# Patient Record
Sex: Male | Born: 1935 | Race: White | Hispanic: No | Marital: Single | Smoking: Never smoker
Health system: Southern US, Community
[De-identification: ages and names within clinical notes are randomized; demographics above are authoritative.]

## PROBLEM LIST (undated history)

## (undated) DIAGNOSIS — E785 Hyperlipidemia, unspecified: Secondary | ICD-10-CM

## (undated) DIAGNOSIS — I4891 Unspecified atrial fibrillation: Secondary | ICD-10-CM

## (undated) DIAGNOSIS — C801 Malignant (primary) neoplasm, unspecified: Secondary | ICD-10-CM

## (undated) DIAGNOSIS — G9340 Encephalopathy, unspecified: Secondary | ICD-10-CM

## (undated) DIAGNOSIS — I10 Essential (primary) hypertension: Secondary | ICD-10-CM

## (undated) DIAGNOSIS — E0590 Thyrotoxicosis, unspecified without thyrotoxic crisis or storm: Secondary | ICD-10-CM

---

## 1997-12-25 ENCOUNTER — Other Ambulatory Visit: Admission: RE | Admit: 1997-12-25 | Discharge: 1997-12-25 | Payer: Self-pay | Admitting: Urology

## 1998-08-03 ENCOUNTER — Ambulatory Visit (HOSPITAL_COMMUNITY): Admission: RE | Admit: 1998-08-03 | Discharge: 1998-08-03 | Payer: Self-pay | Admitting: Gastroenterology

## 1999-08-17 ENCOUNTER — Encounter: Admission: RE | Admit: 1999-08-17 | Discharge: 1999-08-17 | Payer: Self-pay | Admitting: Endocrinology

## 1999-08-17 ENCOUNTER — Encounter: Payer: Self-pay | Admitting: Endocrinology

## 1999-11-25 ENCOUNTER — Encounter: Admission: RE | Admit: 1999-11-25 | Discharge: 1999-11-25 | Payer: Self-pay | Admitting: *Deleted

## 1999-11-25 ENCOUNTER — Encounter: Payer: Self-pay | Admitting: *Deleted

## 2000-04-25 ENCOUNTER — Encounter (INDEPENDENT_AMBULATORY_CARE_PROVIDER_SITE_OTHER): Payer: Self-pay

## 2000-04-25 ENCOUNTER — Other Ambulatory Visit: Admission: RE | Admit: 2000-04-25 | Discharge: 2000-04-25 | Payer: Self-pay | Admitting: Urology

## 2001-11-25 ENCOUNTER — Ambulatory Visit (HOSPITAL_COMMUNITY): Admission: RE | Admit: 2001-11-25 | Discharge: 2001-11-25 | Payer: Self-pay | Admitting: Gastroenterology

## 2002-06-19 ENCOUNTER — Emergency Department (HOSPITAL_COMMUNITY): Admission: EM | Admit: 2002-06-19 | Discharge: 2002-06-19 | Payer: Self-pay | Admitting: Emergency Medicine

## 2002-06-19 ENCOUNTER — Encounter: Payer: Self-pay | Admitting: Emergency Medicine

## 2002-06-26 ENCOUNTER — Encounter: Payer: Self-pay | Admitting: Urology

## 2002-06-26 ENCOUNTER — Ambulatory Visit (HOSPITAL_BASED_OUTPATIENT_CLINIC_OR_DEPARTMENT_OTHER): Admission: RE | Admit: 2002-06-26 | Discharge: 2002-06-26 | Payer: Self-pay | Admitting: Urology

## 2002-07-01 ENCOUNTER — Emergency Department (HOSPITAL_COMMUNITY): Admission: EM | Admit: 2002-07-01 | Discharge: 2002-07-01 | Payer: Self-pay | Admitting: Emergency Medicine

## 2002-07-01 ENCOUNTER — Encounter: Payer: Self-pay | Admitting: Emergency Medicine

## 2002-07-02 ENCOUNTER — Inpatient Hospital Stay (HOSPITAL_COMMUNITY): Admission: EM | Admit: 2002-07-02 | Discharge: 2002-07-04 | Payer: Self-pay | Admitting: Urology

## 2002-07-12 ENCOUNTER — Emergency Department (HOSPITAL_COMMUNITY): Admission: EM | Admit: 2002-07-12 | Discharge: 2002-07-12 | Payer: Self-pay | Admitting: *Deleted

## 2002-07-15 ENCOUNTER — Encounter: Payer: Self-pay | Admitting: Urology

## 2002-07-16 ENCOUNTER — Inpatient Hospital Stay (HOSPITAL_COMMUNITY): Admission: RE | Admit: 2002-07-16 | Discharge: 2002-07-19 | Payer: Self-pay | Admitting: Urology

## 2002-07-16 ENCOUNTER — Encounter (INDEPENDENT_AMBULATORY_CARE_PROVIDER_SITE_OTHER): Payer: Self-pay | Admitting: *Deleted

## 2007-04-17 ENCOUNTER — Encounter: Admission: RE | Admit: 2007-04-17 | Discharge: 2007-04-17 | Payer: Self-pay | Admitting: Endocrinology

## 2009-03-03 ENCOUNTER — Emergency Department (HOSPITAL_COMMUNITY): Admission: EM | Admit: 2009-03-03 | Discharge: 2009-03-03 | Payer: Self-pay | Admitting: Emergency Medicine

## 2010-06-02 ENCOUNTER — Other Ambulatory Visit: Payer: Self-pay | Admitting: Dermatology

## 2010-07-22 NOTE — Discharge Summary (Signed)
   NAME:  Nicholas Hines, Nicholas Hines                          ACCOUNT NO.:  [id number]   MEDICAL RECORD NO.:  [id number]                   PATIENT TYPE:  INP   LOCATION:  0378                                 FACILITY:  [facility]   PHYSICIAN:  Maretta Bees. Vonita Moss, M.D.             DATE OF BIRTH:  18-Aug-1933   DATE OF ADMISSION:  07/02/2002  DATE OF DISCHARGE:  07/04/2002                                 DISCHARGE SUMMARY   FINAL DIAGNOSES:  1. Right ureteral calculi.  2. Urinary retention.  3. Diabetes mellitus type 2, non-insulin-dependent.  4. Hypertension.  5. Diaphragmatic hernia.  6. Right bundle branch block.   PROCEDURE:  1. Cystoscopy.  2. Right retrograde pyelogram with interpretation.  3. Right ureteroscopy.  4. Holmium stone fragmentation and stone basketing.  5. Insertion of right double J catheter July 02, 2002.   HISTORY:  This 77 year old gentleman had a long history of urinary tract  stone disease.  He had a right upper ureteral stone that was treated with  ESL one week before with very little fragmentation.  He did pass a left  ureteral calculus in the meantime and he was brought to the ER today with  severe right flank pain due to the 8 mm stone in the distal right ureter  that has not passed and a new stone in the right upper ureter that used to  be in the kidney measuring 9 mm in size.  He is brought to the operating  room where both stones underwent a Holmium laser fragmentation and the  distal stone was removed completely.  Double J catheter was left in place  and he was observed overnight.  However, he had trouble voiding the next day  and required a dose of Flomax and unfortunately still could not void after  voiding trial and was sent home with a Foley catheter on July 04, 2002.  He  is instructed to remove the Foley catheter in two days and go home on his  usual medications including Actos, Maxzide, Proscar, Cardura, allopurinol,  Zocor, and Pepcid.  He will return to  the office in one week for follow-up.  He is sent home in satisfactory condition.  May resume diabetic diet.                                               Maretta Bees. Vonita Moss, M.D.    LJP/MEDQ  D:  07/09/2002  T:  07/09/2002  Job:  [id number]

## 2010-07-22 NOTE — Consult Note (Signed)
   NAME:  Nicholas Hines, Nicholas Hines NO.:  [id number]   MEDICAL RECORD NO.:  [id number]                   PATIENT TYPE:  EMS   LOCATION:  ED                                   FACILITY:   PHYSICIAN:  Maretta Bees. Vonita Moss, M.D.             DATE OF BIRTH:  01-25-1934   DATE OF CONSULTATION:  06/19/2002  DATE OF DISCHARGE:                                   CONSULTATION   EMERGENCY ROOM CONSULTATION:   REASON FOR CONSULTATION:  I was asked to see this 77 year old gentleman by  Dr. Andres Labrum for evaluation of severe right flank pain.  He has had a long  history of stones.  He has had a basket extraction in the past.  He passed  some small stones about a month ago, but he came in this morning with severe  right flank pain.  He obtained relief in the emergency room here.   He also has history of prostatism and takes Proscar and Cardura and is a  patient of Dr. Etta Grandchild.   MEDICATIONS:  1. Triamterene/HCTZ 75/50.  2. Actos for his diabetes.  3. Zocor for hypercholesterolemia.  4. Zantac for GI distress.  5. Allopurinol.  6. Aspirin 81 mg.   ALLERGIES:  SULFA.   REVIEW OF SYMPTOMS:  He has had no acute GI symptoms.  He has had no fever.  He has had no gross hematuria.   PHYSICAL EXAMINATION:  VITAL SIGNS:  Blood pressure 177/91, respiratory rate  20, heart rate is 66.  GENERAL:  He is alert and oriented. He is now much more relieved.  SKIN:  Warm and dry.  NECK:  Supple.  LUNGS:  No respiratory distress.  ABDOMEN:  Soft and nontender.  PELVIRECTAL:  Penis, urethra, meatus, testicles and epididymis without  lesions.   LABORATORY AND ACCESSORY DATA:  CT scan was done.  He has small renal  calculi, but he has an obstructing 8-mm stone of the right UPJ and there is  a question of  a distal left nonobstructing ureteral stone.   DISCHARGE INSTRUCTIONS:  He was sent home on Demerol and are office will  call and get him scheduled to see Dr. Etta Grandchild for followup and he  will  probably need a lithotripsy and I told him to stop his 81 mg aspirin and let  us know if his Demerol runs low.                                              Maretta Bees. Vonita Moss, M.D.   LJP/MEDQ  D:  06/19/2002  T:  06/19/2002  Job:  [id number]

## 2010-07-22 NOTE — Discharge Summary (Signed)
   NAME:  Nicholas Hines, SANOR                          ACCOUNT NO.:  [id number]   MEDICAL RECORD NO.:  [id number]                   PATIENT TYPE:  INP   LOCATION:  0348                                 FACILITY:  [facility]   PHYSICIAN:  Maretta Bees. Vonita Moss, M.D.             DATE OF BIRTH:  1933-06-05   DATE OF ADMISSION:  07/16/2002  DATE OF DISCHARGE:  07/19/2002                                 DISCHARGE SUMMARY   FINAL DIAGNOSES:  1. Right ureteral calculus.  2. Benign prostatic hypertrophy and urinary retention.  3. Hypertension.  4. Diabetes mellitus.  5. Hypercholesterolemia.   PROCEDURES:  Cystoscopy, right ureteroscopy, holmium laser fragmentation of  renal stone, retrieval of foreign body from renal pelvis, right retrograde  pyelogram, right double-J catheter, transurethral resection of the prostate  Jul 16, 2002.   HISTORY:  This 77 year old white male has had recurrent flank pain from  right renal calculi, having already undergone ESL and ureteroscopy and  holmium laser of the lowermost of his two ureteral stones and partial  fragmentation and holmium laser of the uppermost.  He also has gone into  urinary retention.  He was brought to the hospital for further evaluation  and therapy.   HOSPITAL COURSE:  After admission he underwent cystoscopy and the other  procedures noted above.  At the time of ureteroscopy a small black plastic  ring was found in the renal pelvis which may have fallen off the previous  ureteroscope or the current one but, fortunately, it was retrieved intact  with no harm to the patient.  Postoperatively, he did well with the  exception of some postoperative hemorrhage and some clot retention.  His  urine then cleared nicely.  His Foley was removed on Jul 18, 2002, and he  voided well after that.  He was afebrile and is to return to the office in  one to two weeks in follow-up.   DISCHARGE MEDICATIONS:  1. Allopurinol 100 mg daily.  2. Zocor 40 mg daily.  3. Zantac 150 mg at bedtime.  4. Actos 45 mg daily.  5. Maxzide 75/50 mg daily.  6. Cardura 8 mg daily.  7. He will also go home on 500 mg of Levaquin a day for five days.  8. He has pain medication at home as needed.   DIET:  He will follow a diabetic diet.    CONDITION ON DISCHARGE:  He was sent home in good condition.   ACTIVITY:  He will have light activity for one month.                                               Maretta Bees. Vonita Moss, M.D.    LJP/MEDQ  D:  07/28/2002  T:  07/28/2002  Job:  [id number]

## 2010-07-22 NOTE — Op Note (Signed)
NAME:  Nicholas Hines, Nicholas Hines                          ACCOUNT NO.:  [id number]   MEDICAL RECORD NO.:  [id number]                   PATIENT TYPE:  AMB   LOCATION:  NESC                                 FACILITY:  [facility]   PHYSICIAN:  Maretta Bees. Vonita Moss, M.D.             DATE OF BIRTH:  1933-03-10   DATE OF PROCEDURE:  07/02/2002  DATE OF DISCHARGE:                                 OPERATIVE REPORT   PREOPERATIVE DIAGNOSIS:  Right ureteral calculi.   POSTOPERATIVE DIAGNOSIS:  Right ureteral calculi.   PROCEDURES:  1. Cystoscopy.  2. Right retrograde pyelogram with interpretation.  3. Right ureteroscopy with holmium laser fragmentation of two ureteral     stones and stone basketing of distal stone.  4. Insertion of a right double J catheter.   SURGEON:  Maretta Bees. Vonita Moss, M.D.   ANESTHESIA:  General.   INDICATIONS:  This 77 year old gentleman has had a long history of urinary  tract calculus disease.  He has been having severe intermittent right flank  pain due to an 8 mm stone in the distal ureter that underwent ESL last week  with very little fragmentation.  He passed a left ureteral calculus in the  meantime.  He was seen in the office yesterday with severe right flank pain,  and he has an 8 mm stone in the distal right ureter and a previous right  renal calculus is now in the upper ureter and measures 9 mm in size.  He is  brought to the OR today for further evaluation and therapy and relief of his  symptoms.   DESCRIPTION OF PROCEDURE:  The patient was brought to the operating room and  placed in lithotomy position.  External genitalia were prepped and draped in  the usual fashion.  He was cystoscoped.  Anterior urethra was normal.  The  prostatic urethra was unremarkable.  The bladder was normal.  A guidewire  was placed up the right ureter beyond both stones and a 4 mm balloon  dilating device was used to dilate the intramural ureter up to the lower  stone.  I then inserted the 6  French rigid ureteroscope up to that stone and  it had some pock marks on it from ESL, but it required 365 micron laser at  0.8 watts to fragment the stone in four pieces, and I then retrieved all  four pieces with a stone basket and later got them out of the bladder.  I  then tried to advance the ureteroscope but because of angles, I could not  get up the rigid scope so I inserted a ureteral dilating sheath and then  inserted the ultra-thin flexible ureteroscope and encountered the stone in  the upper ureter.  I used the holmium laser again to fragment and fracture  this stone.  There was limited working space and because of ureteral spasm  and angulation, I felt that I had  to complete the procedure because I took  care to only laser the stone and not the sidewall, and at this point I felt  the stone was likely fractured enough to pass and certainly would be  amenable to ESL in the future.  With the guidewire still in place, I back-  loaded the cystoscope and performed a right retrograde pyelogram showing an  intact and slightly full right renal pelvis and collecting system.  Over a  guidewire I inserted a 6 [place], 26 cm double J catheter  with a full coil in the upper calyceal system of the kidney and a full coil  in the bladder, bypassing this upper ureteral stone.  The bladder was  emptied and the scope removed and the patient sent to the recovery room in  good condition, having tolerated the procedure well.                                                Maretta Bees. Vonita Moss, M.D.    LJP/MEDQ  D:  07/02/2002  T:  07/02/2002  Job:  [id number]

## 2010-07-22 NOTE — Op Note (Signed)
NAME:  Nicholas Hines, Nicholas Hines                          ACCOUNT NO.:  [id number]   MEDICAL RECORD NO.:  [id number]                   PATIENT TYPE:  INP   LOCATION:  0348                                 FACILITY:  [facility]   PHYSICIAN:  Maretta Bees. Vonita Moss, M.D.             DATE OF BIRTH:  [birth year]   DATE OF PROCEDURE:  07/16/2002  DATE OF DISCHARGE:                                 OPERATIVE REPORT   PREOPERATIVE DIAGNOSES:  1. Right upper ureteral calculus.  2. Benign prostatic hypertrophy with urinary retention.   POSTOPERATIVE DIAGNOSES:  1. Right upper ureteral calculus.  2. Benign prostatic hypertrophy with urinary retention.  3. Renal foreign body.   OPERATION/PROCEDURE:  1. Cystoscopy.  2. Right ureteroscopy.  3. Holmium laser fragmentation of renal stone and retrieval of a foreign     body from the renal pelvis.  4. Right retrograde pyelogram interpretation.  5. Right double-J catheter placement.  6. Transurethral resection of the prostate.   SURGEON:  Maretta Bees. Vonita Moss, M.D.   ANESTHESIA:  General.   INDICATIONS:  This 77 year old white male has had significant problems with  right renal calculi, having had one stone already fractured and removed by  means of ESL and ureteroscopy and holmium laser fragmentation.  A right  upper ureteral stone persists having been partially fragmented with a  holmium laser at recent ureteroscopy.  He has also been in urinary retention  having failed voiding trials despite being on chronic medical therapy.  He  is brought to the OR today for right ureteroscopy and treatment of his right  upper ureteral stone and TUR of the prostate.   DESCRIPTION OF PROCEDURE:  The patient was brought to the operating room,  placed in the lithotomy position.  External genitalia prepped and draped in  the usual fashion.  He was cystoscoped.  His right double-J catheter was  removed and the guide wire placed up beyond the right upper ureteral  calculus.  I  then inserted a long ureteral access sheath and in doing so,  pushed the kidney back up in the renal pelvis.  With the access sheath in  place, I inserted a ultra-thin flexible ureteroscope and entered the renal  pelvis where I saw the partially fragmented stone as noted above, but also a  small circular, dark gray to black foreign body that looks like a small  piece of a plastic ring.  I could not retrieve it with a flexible scope  because the grasper would not bend adequately to retrieve it, plus it was  floating around quite freely.  Fortunately, it floated down into the  ureteral access sheath and then I inserted a three-prong grasper and put it  beyond the foreign body and used to trap and bring it out through the  ureteral access sheath.  It appears to be either a small plastic ring of a  previous ureteroscopic procedure or possibly  this procedure.  The patient  will be advised postoperatively about this finding.  I then proceeded with holmium laser fragmentation of the stone which was  very hard and was tending to bounce about.  A couple of times I was able to  drop it in the calyx and broke it into at least three pieces.  I tried to  grasp one of them but it was too big to bring down the access sheath and I  feel like these fragments will now be a passable size.  Therefore, I put up  a #6-French, 26 cm double-J catheter after obtaining a right retrograde  pyelogram that showed the pyelocalyceal system to be intact.  The double J  catheter was in good position with a full coil in the renal pelvis and a  full coil in the bladder.  I then proceeded with TUR of the prostate after sounding him to 30-French  and putting in a 28-French resectoscope sheath.  He had a very vascular  prostate with a lot of tissue at the bladder neck and the floor.  When I  completed the procedure, I felt like he had a well receptive prostatic  fossa.  Residual chips were removed from the bladder.  Hemostasis  was good.  External sphincter seemed to be intact as the scope was removed.  A 24-  French, 30 mL Foley catheter was inserted with 40 mL placed in the balloon  and irrigation cleared nicely.  Traction was put on the catheter and he was  taken to the recovery room in good condition having tolerated the procedure  well.                                                 Maretta Bees. Vonita Moss, M.D.    LJP/MEDQ  D:  07/16/2002  T:  07/17/2002  Job:  [id number]

## 2010-07-22 NOTE — Op Note (Signed)
   NAME:  Nicholas Hines, Nicholas Hines                          ACCOUNT NO.:  [id number]   MEDICAL RECORD NO.:  [id number]                   PATIENT TYPE:  AMB   LOCATION:  ENDO                                 FACILITY:  [facility]   PHYSICIAN:  James L. Malon Kindle., M.D.          DATE OF BIRTH:  10/26/1933   DATE OF PROCEDURE:  11/25/2001  DATE OF DISCHARGE:                                 OPERATIVE REPORT   PROCEDURE:  Colonoscopy.   MEDICATIONS:  Fentanyl 90 mcg, Versed 9 mg IV.   ENDOSCOPE:  Olympus pediatric video colonoscope.   INDICATIONS:  A previous history of adenomatous polyps.  This is done as a  follow-up.   DESCRIPTION OF PROCEDURE:  The procedure had been explained to the patient  and consent obtained.  With the patient in the left lateral decubitus  position, the Olympus pediatric video colonoscope was inserted, advanced  under direct visualization.  Prep was excellent.  The patient had moderate  diverticular disease.  We were able to pass the sigmoid and advance fairly  easily to the cecum, the ileocecal valve and appendiceal orifice seen.  The  scope was withdrawn and the cecum, ascending colon, hepatic flexure,  transverse colon, splenic flexure, descending and sigmoid colon were seen  well and no polyps seen.  Again moderate diverticulosis in the sigmoid  colon.  The rectum was free of polyps.  The scope was withdrawn.  The  patient tolerated the procedure well.   ASSESSMENT:  1. No evidence of further colon polyps.  2. Moderate diverticulosis.   PLAN:  Will recommend yearly Hemoccults, and I will repeat the procedure in  five years.                                               James L. Malon Kindle., M.D.    Waldron Session  D:  11/25/2001  T:  11/26/2001  Job:  [id number]   cc:   Alfonse Alpers. Dagoberto Ligas, M.D.  [address]  [place]  [place] [address]  Fax: [phone]

## 2010-07-22 NOTE — H&P (Signed)
NAME:  Nicholas Hines, SCHELLHASE                          ACCOUNT NO.:  [id number]   MEDICAL RECORD NO.:  [id number]                   PATIENT TYPE:  INP   LOCATION:  0010                                 FACILITY:  [facility]   PHYSICIAN:  Maretta Bees. Vonita Moss, M.D.             DATE OF BIRTH:  [birth year]   DATE OF ADMISSION:  07/16/2002  DATE OF DISCHARGE:                                HISTORY & PHYSICAL   HISTORY OF PRESENT ILLNESS:  This 77 year old white male has had a past  history of prostatism and has been on Flomax and Proscar and also has a  history of urinary tract calculus disease.  He presented with severe right  flank pain and an 8 mm stone that was moved into the distal ureter and he  underwent ESL with very little fragmentation in April.  He returned to the  office with persistent right flank pain and a 9 mm stone now moved into the  upper ureter on the right side.  In the meantime he also passed a 5 mm stone  from the left ureter.  On July 02, 2002 he underwent right ureteroscopy and  Holmium laser fragmentation and removal of the distal stone and  fragmentation of the proximal stone and double J catheter was left in place  with the thought that that would require further intervention either with  ESL or further ureteroscopy.  However, in the meantime he has developed  recurrent urinary retention and has failed several voiding trials and then  developed fever and prostatitis and is on Levaquin.  He is brought to the  hospital today for cystoscopy, right ureteroscopy, stone fragmentation, and  TUR of the prostate.   PAST MEDICAL HISTORY:  1. Hypertension.  2. Some chronic sinus difficulties.  3. Diabetes controlled with oral agents.  4. He also has history of surgery including bilateral inguinal hernia     repair.   MEDICATIONS:  1. Levaquin 500 mg daily.  2. Allopurinol 100 mg daily.  3. Aspirin 81 mg which he has not taken for a month.  4. Zantac 150 mg at bedtime.  5. Zocor  40 mg at bedtime.  6. Cardura 8 mg daily.  7. Proscar 5 mg daily.  8. Actos 45 mg daily.  9. Maxzide 75/50 mg daily.   ALLERGIES:  He is allergic to SULFA.   FAMILY HISTORY:  He does not drink alcohol nor smoke.   FAMILY HISTORY:  Noncontributory.   REVIEW OF SYSTEMS:  Noted on health history form.   PHYSICAL EXAMINATION:  VITAL SIGNS:  Blood pressure 130/76, pulse 96,  temperature 100.2.  GENERAL:  He is alert and oriented.  He is in no acute distress.  SKIN:  Warm and dry.  ABDOMEN:  Soft, nontender.  GENITOURINARY:  Foley catheter is in place.  RECTAL:  Prostate feels benign and smooth.   IMPRESSION:  1. Right ureteral calculus.  2.  Benign prostatic hypertrophy and urinary retention.  3. Hypertension.  4. Diabetes mellitus.  5. Hypercholesterolemia.   PLAN:  Cystoscopy, retrograde pyelogram, ureteroscopy, and transurethral  resection of the prostate.                                               Maretta Bees. Vonita Moss, M.D.    LJP/MEDQ  D:  07/16/2002  T:  07/16/2002  Job:  [id number]

## 2011-01-19 ENCOUNTER — Other Ambulatory Visit: Payer: Self-pay | Admitting: Dermatology

## 2011-04-19 DIAGNOSIS — R972 Elevated prostate specific antigen [PSA]: Secondary | ICD-10-CM | POA: Diagnosis not present

## 2011-04-26 DIAGNOSIS — N200 Calculus of kidney: Secondary | ICD-10-CM | POA: Diagnosis not present

## 2011-04-26 DIAGNOSIS — R972 Elevated prostate specific antigen [PSA]: Secondary | ICD-10-CM | POA: Diagnosis not present

## 2011-04-26 DIAGNOSIS — N401 Enlarged prostate with lower urinary tract symptoms: Secondary | ICD-10-CM | POA: Diagnosis not present

## 2011-07-05 DIAGNOSIS — E785 Hyperlipidemia, unspecified: Secondary | ICD-10-CM | POA: Diagnosis not present

## 2011-07-05 DIAGNOSIS — E119 Type 2 diabetes mellitus without complications: Secondary | ICD-10-CM | POA: Diagnosis not present

## 2011-07-05 DIAGNOSIS — I10 Essential (primary) hypertension: Secondary | ICD-10-CM | POA: Diagnosis not present

## 2011-09-18 DIAGNOSIS — K219 Gastro-esophageal reflux disease without esophagitis: Secondary | ICD-10-CM | POA: Diagnosis not present

## 2011-09-18 DIAGNOSIS — R0789 Other chest pain: Secondary | ICD-10-CM | POA: Diagnosis not present

## 2011-10-13 DIAGNOSIS — R21 Rash and other nonspecific skin eruption: Secondary | ICD-10-CM | POA: Diagnosis not present

## 2011-10-16 DIAGNOSIS — T7840XA Allergy, unspecified, initial encounter: Secondary | ICD-10-CM | POA: Diagnosis not present

## 2011-10-16 DIAGNOSIS — R21 Rash and other nonspecific skin eruption: Secondary | ICD-10-CM | POA: Diagnosis not present

## 2011-10-20 DIAGNOSIS — Z86010 Personal history of colonic polyps: Secondary | ICD-10-CM | POA: Diagnosis not present

## 2011-10-20 DIAGNOSIS — Z09 Encounter for follow-up examination after completed treatment for conditions other than malignant neoplasm: Secondary | ICD-10-CM | POA: Diagnosis not present

## 2011-10-20 DIAGNOSIS — K5730 Diverticulosis of large intestine without perforation or abscess without bleeding: Secondary | ICD-10-CM | POA: Diagnosis not present

## 2011-10-20 DIAGNOSIS — K648 Other hemorrhoids: Secondary | ICD-10-CM | POA: Diagnosis not present

## 2011-10-20 DIAGNOSIS — D175 Benign lipomatous neoplasm of intra-abdominal organs: Secondary | ICD-10-CM | POA: Diagnosis not present

## 2011-12-12 DIAGNOSIS — H26019 Infantile and juvenile cortical, lamellar, or zonular cataract, unspecified eye: Secondary | ICD-10-CM | POA: Diagnosis not present

## 2011-12-12 DIAGNOSIS — E119 Type 2 diabetes mellitus without complications: Secondary | ICD-10-CM | POA: Diagnosis not present

## 2011-12-12 DIAGNOSIS — H2510 Age-related nuclear cataract, unspecified eye: Secondary | ICD-10-CM | POA: Diagnosis not present

## 2012-01-08 DIAGNOSIS — E785 Hyperlipidemia, unspecified: Secondary | ICD-10-CM | POA: Diagnosis not present

## 2012-01-08 DIAGNOSIS — I10 Essential (primary) hypertension: Secondary | ICD-10-CM | POA: Diagnosis not present

## 2012-01-08 DIAGNOSIS — Z23 Encounter for immunization: Secondary | ICD-10-CM | POA: Diagnosis not present

## 2012-01-08 DIAGNOSIS — E119 Type 2 diabetes mellitus without complications: Secondary | ICD-10-CM | POA: Diagnosis not present

## 2012-01-17 DIAGNOSIS — L821 Other seborrheic keratosis: Secondary | ICD-10-CM | POA: Diagnosis not present

## 2012-01-17 DIAGNOSIS — L570 Actinic keratosis: Secondary | ICD-10-CM | POA: Diagnosis not present

## 2012-01-17 DIAGNOSIS — Z85828 Personal history of other malignant neoplasm of skin: Secondary | ICD-10-CM | POA: Diagnosis not present

## 2012-02-06 DIAGNOSIS — J069 Acute upper respiratory infection, unspecified: Secondary | ICD-10-CM | POA: Diagnosis not present

## 2012-04-09 DIAGNOSIS — R143 Flatulence: Secondary | ICD-10-CM | POA: Diagnosis not present

## 2012-04-26 DIAGNOSIS — N401 Enlarged prostate with lower urinary tract symptoms: Secondary | ICD-10-CM | POA: Diagnosis not present

## 2012-05-03 DIAGNOSIS — N401 Enlarged prostate with lower urinary tract symptoms: Secondary | ICD-10-CM | POA: Diagnosis not present

## 2012-05-03 DIAGNOSIS — N200 Calculus of kidney: Secondary | ICD-10-CM | POA: Diagnosis not present

## 2012-05-03 DIAGNOSIS — R972 Elevated prostate specific antigen [PSA]: Secondary | ICD-10-CM | POA: Diagnosis not present

## 2012-06-18 DIAGNOSIS — R141 Gas pain: Secondary | ICD-10-CM | POA: Diagnosis not present

## 2012-07-08 DIAGNOSIS — I10 Essential (primary) hypertension: Secondary | ICD-10-CM | POA: Diagnosis not present

## 2012-07-08 DIAGNOSIS — K219 Gastro-esophageal reflux disease without esophagitis: Secondary | ICD-10-CM | POA: Diagnosis not present

## 2012-07-08 DIAGNOSIS — E785 Hyperlipidemia, unspecified: Secondary | ICD-10-CM | POA: Diagnosis not present

## 2012-07-08 DIAGNOSIS — E119 Type 2 diabetes mellitus without complications: Secondary | ICD-10-CM | POA: Diagnosis not present

## 2012-09-23 DIAGNOSIS — M25579 Pain in unspecified ankle and joints of unspecified foot: Secondary | ICD-10-CM | POA: Diagnosis not present

## 2012-11-11 DIAGNOSIS — R7989 Other specified abnormal findings of blood chemistry: Secondary | ICD-10-CM | POA: Diagnosis not present

## 2012-11-11 DIAGNOSIS — Z23 Encounter for immunization: Secondary | ICD-10-CM | POA: Diagnosis not present

## 2012-11-11 DIAGNOSIS — I10 Essential (primary) hypertension: Secondary | ICD-10-CM | POA: Diagnosis not present

## 2012-11-11 DIAGNOSIS — E785 Hyperlipidemia, unspecified: Secondary | ICD-10-CM | POA: Diagnosis not present

## 2012-11-11 DIAGNOSIS — E119 Type 2 diabetes mellitus without complications: Secondary | ICD-10-CM | POA: Diagnosis not present

## 2012-12-24 DIAGNOSIS — H2510 Age-related nuclear cataract, unspecified eye: Secondary | ICD-10-CM | POA: Diagnosis not present

## 2012-12-24 DIAGNOSIS — E119 Type 2 diabetes mellitus without complications: Secondary | ICD-10-CM | POA: Diagnosis not present

## 2012-12-24 DIAGNOSIS — IMO0002 Reserved for concepts with insufficient information to code with codable children: Secondary | ICD-10-CM | POA: Diagnosis not present

## 2013-01-15 DIAGNOSIS — D485 Neoplasm of uncertain behavior of skin: Secondary | ICD-10-CM | POA: Diagnosis not present

## 2013-01-15 DIAGNOSIS — L821 Other seborrheic keratosis: Secondary | ICD-10-CM | POA: Diagnosis not present

## 2013-01-15 DIAGNOSIS — L570 Actinic keratosis: Secondary | ICD-10-CM | POA: Diagnosis not present

## 2013-01-15 DIAGNOSIS — Z85828 Personal history of other malignant neoplasm of skin: Secondary | ICD-10-CM | POA: Diagnosis not present

## 2013-01-15 DIAGNOSIS — C4441 Basal cell carcinoma of skin of scalp and neck: Secondary | ICD-10-CM | POA: Diagnosis not present

## 2013-03-04 DIAGNOSIS — E119 Type 2 diabetes mellitus without complications: Secondary | ICD-10-CM | POA: Diagnosis not present

## 2013-03-04 DIAGNOSIS — E785 Hyperlipidemia, unspecified: Secondary | ICD-10-CM | POA: Diagnosis not present

## 2013-03-11 DIAGNOSIS — E119 Type 2 diabetes mellitus without complications: Secondary | ICD-10-CM | POA: Diagnosis not present

## 2013-03-11 DIAGNOSIS — N400 Enlarged prostate without lower urinary tract symptoms: Secondary | ICD-10-CM | POA: Diagnosis not present

## 2013-03-11 DIAGNOSIS — E785 Hyperlipidemia, unspecified: Secondary | ICD-10-CM | POA: Diagnosis not present

## 2013-03-11 DIAGNOSIS — I10 Essential (primary) hypertension: Secondary | ICD-10-CM | POA: Diagnosis not present

## 2013-03-11 DIAGNOSIS — K2100 Gastro-esophageal reflux disease with esophagitis, without bleeding: Secondary | ICD-10-CM | POA: Diagnosis not present

## 2013-04-03 DIAGNOSIS — H9190 Unspecified hearing loss, unspecified ear: Secondary | ICD-10-CM | POA: Diagnosis not present

## 2013-04-03 DIAGNOSIS — H905 Unspecified sensorineural hearing loss: Secondary | ICD-10-CM | POA: Diagnosis not present

## 2013-04-03 DIAGNOSIS — H903 Sensorineural hearing loss, bilateral: Secondary | ICD-10-CM | POA: Diagnosis not present

## 2013-04-24 ENCOUNTER — Other Ambulatory Visit: Payer: Self-pay | Admitting: Gastroenterology

## 2013-04-24 DIAGNOSIS — R1011 Right upper quadrant pain: Secondary | ICD-10-CM

## 2013-04-24 DIAGNOSIS — R141 Gas pain: Secondary | ICD-10-CM | POA: Diagnosis not present

## 2013-04-24 DIAGNOSIS — R143 Flatulence: Secondary | ICD-10-CM | POA: Diagnosis not present

## 2013-04-25 ENCOUNTER — Ambulatory Visit
Admission: RE | Admit: 2013-04-25 | Discharge: 2013-04-25 | Disposition: A | Discharge status: Home / Self Care | Payer: BC Managed Care – PPO | Source: Ambulatory Visit | Attending: Gastroenterology | Admitting: Gastroenterology

## 2013-04-25 DIAGNOSIS — R1011 Right upper quadrant pain: Secondary | ICD-10-CM

## 2013-04-25 DIAGNOSIS — N189 Chronic kidney disease, unspecified: Secondary | ICD-10-CM | POA: Diagnosis not present

## 2013-05-08 DIAGNOSIS — R1013 Epigastric pain: Secondary | ICD-10-CM | POA: Diagnosis not present

## 2013-05-08 DIAGNOSIS — K449 Diaphragmatic hernia without obstruction or gangrene: Secondary | ICD-10-CM | POA: Diagnosis not present

## 2013-05-08 DIAGNOSIS — K3189 Other diseases of stomach and duodenum: Secondary | ICD-10-CM | POA: Diagnosis not present

## 2013-05-09 ENCOUNTER — Other Ambulatory Visit (HOSPITAL_COMMUNITY): Payer: Self-pay | Admitting: Gastroenterology

## 2013-05-09 DIAGNOSIS — R1011 Right upper quadrant pain: Secondary | ICD-10-CM

## 2013-05-20 ENCOUNTER — Ambulatory Visit (HOSPITAL_COMMUNITY)
Admission: RE | Admit: 2013-05-20 | Discharge: 2013-05-20 | Disposition: A | Discharge status: Home / Self Care | Payer: Medicare Other | Source: Ambulatory Visit | Attending: Gastroenterology | Admitting: Gastroenterology

## 2013-05-20 DIAGNOSIS — R1011 Right upper quadrant pain: Secondary | ICD-10-CM

## 2013-05-20 DIAGNOSIS — R109 Unspecified abdominal pain: Secondary | ICD-10-CM | POA: Diagnosis not present

## 2013-05-20 MED ORDER — TECHNETIUM TC 99M SULFUR COLLOID
2.1000 | Freq: Once | INTRAVENOUS | Status: AC | PRN
  Administered 2013-05-20: 2.1 via ORAL

## 2013-06-19 DIAGNOSIS — R143 Flatulence: Secondary | ICD-10-CM | POA: Diagnosis not present

## 2013-06-19 DIAGNOSIS — R141 Gas pain: Secondary | ICD-10-CM | POA: Diagnosis not present

## 2013-08-01 DIAGNOSIS — R972 Elevated prostate specific antigen [PSA]: Secondary | ICD-10-CM | POA: Diagnosis not present

## 2013-08-08 DIAGNOSIS — N200 Calculus of kidney: Secondary | ICD-10-CM | POA: Diagnosis not present

## 2013-08-08 DIAGNOSIS — R972 Elevated prostate specific antigen [PSA]: Secondary | ICD-10-CM | POA: Diagnosis not present

## 2013-08-08 DIAGNOSIS — N138 Other obstructive and reflux uropathy: Secondary | ICD-10-CM | POA: Diagnosis not present

## 2013-08-08 DIAGNOSIS — N139 Obstructive and reflux uropathy, unspecified: Secondary | ICD-10-CM | POA: Diagnosis not present

## 2013-08-08 DIAGNOSIS — N401 Enlarged prostate with lower urinary tract symptoms: Secondary | ICD-10-CM | POA: Diagnosis not present

## 2013-09-08 DIAGNOSIS — E785 Hyperlipidemia, unspecified: Secondary | ICD-10-CM | POA: Diagnosis not present

## 2013-09-08 DIAGNOSIS — E119 Type 2 diabetes mellitus without complications: Secondary | ICD-10-CM | POA: Diagnosis not present

## 2013-09-08 DIAGNOSIS — I10 Essential (primary) hypertension: Secondary | ICD-10-CM | POA: Diagnosis not present

## 2013-09-10 DIAGNOSIS — E119 Type 2 diabetes mellitus without complications: Secondary | ICD-10-CM | POA: Diagnosis not present

## 2013-09-10 DIAGNOSIS — I10 Essential (primary) hypertension: Secondary | ICD-10-CM | POA: Diagnosis not present

## 2013-09-10 DIAGNOSIS — K2100 Gastro-esophageal reflux disease with esophagitis, without bleeding: Secondary | ICD-10-CM | POA: Diagnosis not present

## 2013-09-10 DIAGNOSIS — E785 Hyperlipidemia, unspecified: Secondary | ICD-10-CM | POA: Diagnosis not present

## 2013-09-11 DIAGNOSIS — R972 Elevated prostate specific antigen [PSA]: Secondary | ICD-10-CM | POA: Diagnosis not present

## 2013-12-25 DIAGNOSIS — Z23 Encounter for immunization: Secondary | ICD-10-CM | POA: Diagnosis not present

## 2013-12-30 DIAGNOSIS — H2513 Age-related nuclear cataract, bilateral: Secondary | ICD-10-CM | POA: Diagnosis not present

## 2013-12-30 DIAGNOSIS — E119 Type 2 diabetes mellitus without complications: Secondary | ICD-10-CM | POA: Diagnosis not present

## 2014-01-14 DIAGNOSIS — D1801 Hemangioma of skin and subcutaneous tissue: Secondary | ICD-10-CM | POA: Diagnosis not present

## 2014-01-14 DIAGNOSIS — L821 Other seborrheic keratosis: Secondary | ICD-10-CM | POA: Diagnosis not present

## 2014-01-14 DIAGNOSIS — L570 Actinic keratosis: Secondary | ICD-10-CM | POA: Diagnosis not present

## 2014-01-14 DIAGNOSIS — Z85828 Personal history of other malignant neoplasm of skin: Secondary | ICD-10-CM | POA: Diagnosis not present

## 2014-02-06 DIAGNOSIS — E119 Type 2 diabetes mellitus without complications: Secondary | ICD-10-CM | POA: Diagnosis not present

## 2014-02-06 DIAGNOSIS — E785 Hyperlipidemia, unspecified: Secondary | ICD-10-CM | POA: Diagnosis not present

## 2014-02-11 DIAGNOSIS — N400 Enlarged prostate without lower urinary tract symptoms: Secondary | ICD-10-CM | POA: Diagnosis not present

## 2014-02-11 DIAGNOSIS — I10 Essential (primary) hypertension: Secondary | ICD-10-CM | POA: Diagnosis not present

## 2014-03-13 DIAGNOSIS — M545 Low back pain: Secondary | ICD-10-CM | POA: Diagnosis not present

## 2014-03-13 DIAGNOSIS — N390 Urinary tract infection, site not specified: Secondary | ICD-10-CM | POA: Diagnosis not present

## 2014-03-13 DIAGNOSIS — R350 Frequency of micturition: Secondary | ICD-10-CM | POA: Diagnosis not present

## 2014-06-08 DIAGNOSIS — E119 Type 2 diabetes mellitus without complications: Secondary | ICD-10-CM | POA: Diagnosis not present

## 2014-06-08 DIAGNOSIS — N400 Enlarged prostate without lower urinary tract symptoms: Secondary | ICD-10-CM | POA: Diagnosis not present

## 2014-06-08 DIAGNOSIS — R972 Elevated prostate specific antigen [PSA]: Secondary | ICD-10-CM | POA: Diagnosis not present

## 2014-06-08 DIAGNOSIS — I10 Essential (primary) hypertension: Secondary | ICD-10-CM | POA: Diagnosis not present

## 2014-08-05 DIAGNOSIS — R972 Elevated prostate specific antigen [PSA]: Secondary | ICD-10-CM | POA: Diagnosis not present

## 2014-08-07 DIAGNOSIS — R42 Dizziness and giddiness: Secondary | ICD-10-CM | POA: Diagnosis not present

## 2014-08-12 DIAGNOSIS — N200 Calculus of kidney: Secondary | ICD-10-CM | POA: Diagnosis not present

## 2014-08-12 DIAGNOSIS — N401 Enlarged prostate with lower urinary tract symptoms: Secondary | ICD-10-CM | POA: Diagnosis not present

## 2014-08-12 DIAGNOSIS — R972 Elevated prostate specific antigen [PSA]: Secondary | ICD-10-CM | POA: Diagnosis not present

## 2014-08-12 DIAGNOSIS — R3916 Straining to void: Secondary | ICD-10-CM | POA: Diagnosis not present

## 2014-11-18 DIAGNOSIS — E1122 Type 2 diabetes mellitus with diabetic chronic kidney disease: Secondary | ICD-10-CM | POA: Diagnosis not present

## 2014-11-18 DIAGNOSIS — I10 Essential (primary) hypertension: Secondary | ICD-10-CM | POA: Diagnosis not present

## 2014-11-23 DIAGNOSIS — R7301 Impaired fasting glucose: Secondary | ICD-10-CM | POA: Diagnosis not present

## 2014-11-23 DIAGNOSIS — Z23 Encounter for immunization: Secondary | ICD-10-CM | POA: Diagnosis not present

## 2014-11-23 DIAGNOSIS — N400 Enlarged prostate without lower urinary tract symptoms: Secondary | ICD-10-CM | POA: Diagnosis not present

## 2014-11-23 DIAGNOSIS — I10 Essential (primary) hypertension: Secondary | ICD-10-CM | POA: Diagnosis not present

## 2014-12-22 DIAGNOSIS — H2513 Age-related nuclear cataract, bilateral: Secondary | ICD-10-CM | POA: Diagnosis not present

## 2014-12-22 DIAGNOSIS — E119 Type 2 diabetes mellitus without complications: Secondary | ICD-10-CM | POA: Diagnosis not present

## 2015-01-06 DIAGNOSIS — K219 Gastro-esophageal reflux disease without esophagitis: Secondary | ICD-10-CM | POA: Diagnosis not present

## 2015-01-06 DIAGNOSIS — R141 Gas pain: Secondary | ICD-10-CM | POA: Diagnosis not present

## 2015-01-14 DIAGNOSIS — Z85828 Personal history of other malignant neoplasm of skin: Secondary | ICD-10-CM | POA: Diagnosis not present

## 2015-01-14 DIAGNOSIS — L281 Prurigo nodularis: Secondary | ICD-10-CM | POA: Diagnosis not present

## 2015-01-14 DIAGNOSIS — D1801 Hemangioma of skin and subcutaneous tissue: Secondary | ICD-10-CM | POA: Diagnosis not present

## 2015-01-14 DIAGNOSIS — L821 Other seborrheic keratosis: Secondary | ICD-10-CM | POA: Diagnosis not present

## 2015-01-14 DIAGNOSIS — D2271 Melanocytic nevi of right lower limb, including hip: Secondary | ICD-10-CM | POA: Diagnosis not present

## 2015-01-14 DIAGNOSIS — L570 Actinic keratosis: Secondary | ICD-10-CM | POA: Diagnosis not present

## 2015-01-14 DIAGNOSIS — D692 Other nonthrombocytopenic purpura: Secondary | ICD-10-CM | POA: Diagnosis not present

## 2015-03-11 DIAGNOSIS — R05 Cough: Secondary | ICD-10-CM | POA: Diagnosis not present

## 2015-03-11 DIAGNOSIS — J00 Acute nasopharyngitis [common cold]: Secondary | ICD-10-CM | POA: Diagnosis not present

## 2015-05-26 DIAGNOSIS — R972 Elevated prostate specific antigen [PSA]: Secondary | ICD-10-CM | POA: Diagnosis not present

## 2015-05-26 DIAGNOSIS — E119 Type 2 diabetes mellitus without complications: Secondary | ICD-10-CM | POA: Diagnosis not present

## 2015-05-28 DIAGNOSIS — I10 Essential (primary) hypertension: Secondary | ICD-10-CM | POA: Diagnosis not present

## 2015-05-28 DIAGNOSIS — E119 Type 2 diabetes mellitus without complications: Secondary | ICD-10-CM | POA: Diagnosis not present

## 2015-05-28 DIAGNOSIS — E782 Mixed hyperlipidemia: Secondary | ICD-10-CM | POA: Diagnosis not present

## 2015-05-28 DIAGNOSIS — N400 Enlarged prostate without lower urinary tract symptoms: Secondary | ICD-10-CM | POA: Diagnosis not present

## 2015-06-29 DIAGNOSIS — M5033 Other cervical disc degeneration, cervicothoracic region: Secondary | ICD-10-CM | POA: Diagnosis not present

## 2015-06-29 DIAGNOSIS — M9901 Segmental and somatic dysfunction of cervical region: Secondary | ICD-10-CM | POA: Diagnosis not present

## 2015-06-30 DIAGNOSIS — M5033 Other cervical disc degeneration, cervicothoracic region: Secondary | ICD-10-CM | POA: Diagnosis not present

## 2015-06-30 DIAGNOSIS — M9901 Segmental and somatic dysfunction of cervical region: Secondary | ICD-10-CM | POA: Diagnosis not present

## 2015-07-05 DIAGNOSIS — R141 Gas pain: Secondary | ICD-10-CM | POA: Diagnosis not present

## 2015-07-05 DIAGNOSIS — K219 Gastro-esophageal reflux disease without esophagitis: Secondary | ICD-10-CM | POA: Diagnosis not present

## 2015-07-06 DIAGNOSIS — M9901 Segmental and somatic dysfunction of cervical region: Secondary | ICD-10-CM | POA: Diagnosis not present

## 2015-07-06 DIAGNOSIS — M5033 Other cervical disc degeneration, cervicothoracic region: Secondary | ICD-10-CM | POA: Diagnosis not present

## 2015-07-07 DIAGNOSIS — M9901 Segmental and somatic dysfunction of cervical region: Secondary | ICD-10-CM | POA: Diagnosis not present

## 2015-07-07 DIAGNOSIS — M5033 Other cervical disc degeneration, cervicothoracic region: Secondary | ICD-10-CM | POA: Diagnosis not present

## 2015-07-08 DIAGNOSIS — M5033 Other cervical disc degeneration, cervicothoracic region: Secondary | ICD-10-CM | POA: Diagnosis not present

## 2015-07-08 DIAGNOSIS — M9901 Segmental and somatic dysfunction of cervical region: Secondary | ICD-10-CM | POA: Diagnosis not present

## 2015-07-12 DIAGNOSIS — M5033 Other cervical disc degeneration, cervicothoracic region: Secondary | ICD-10-CM | POA: Diagnosis not present

## 2015-07-12 DIAGNOSIS — M9901 Segmental and somatic dysfunction of cervical region: Secondary | ICD-10-CM | POA: Diagnosis not present

## 2015-07-14 DIAGNOSIS — M5033 Other cervical disc degeneration, cervicothoracic region: Secondary | ICD-10-CM | POA: Diagnosis not present

## 2015-07-14 DIAGNOSIS — M9901 Segmental and somatic dysfunction of cervical region: Secondary | ICD-10-CM | POA: Diagnosis not present

## 2015-07-15 DIAGNOSIS — M9901 Segmental and somatic dysfunction of cervical region: Secondary | ICD-10-CM | POA: Diagnosis not present

## 2015-07-15 DIAGNOSIS — M5033 Other cervical disc degeneration, cervicothoracic region: Secondary | ICD-10-CM | POA: Diagnosis not present

## 2015-07-19 DIAGNOSIS — M5033 Other cervical disc degeneration, cervicothoracic region: Secondary | ICD-10-CM | POA: Diagnosis not present

## 2015-07-19 DIAGNOSIS — M9901 Segmental and somatic dysfunction of cervical region: Secondary | ICD-10-CM | POA: Diagnosis not present

## 2015-07-29 DIAGNOSIS — M5033 Other cervical disc degeneration, cervicothoracic region: Secondary | ICD-10-CM | POA: Diagnosis not present

## 2015-07-29 DIAGNOSIS — M9901 Segmental and somatic dysfunction of cervical region: Secondary | ICD-10-CM | POA: Diagnosis not present

## 2015-08-27 DIAGNOSIS — R972 Elevated prostate specific antigen [PSA]: Secondary | ICD-10-CM | POA: Diagnosis not present

## 2015-09-03 DIAGNOSIS — R350 Frequency of micturition: Secondary | ICD-10-CM | POA: Diagnosis not present

## 2015-09-03 DIAGNOSIS — R972 Elevated prostate specific antigen [PSA]: Secondary | ICD-10-CM | POA: Diagnosis not present

## 2015-09-03 DIAGNOSIS — N200 Calculus of kidney: Secondary | ICD-10-CM | POA: Diagnosis not present

## 2015-09-03 DIAGNOSIS — N401 Enlarged prostate with lower urinary tract symptoms: Secondary | ICD-10-CM | POA: Diagnosis not present

## 2015-10-06 DIAGNOSIS — E782 Mixed hyperlipidemia: Secondary | ICD-10-CM | POA: Diagnosis not present

## 2015-10-06 DIAGNOSIS — E119 Type 2 diabetes mellitus without complications: Secondary | ICD-10-CM | POA: Diagnosis not present

## 2015-10-08 DIAGNOSIS — N400 Enlarged prostate without lower urinary tract symptoms: Secondary | ICD-10-CM | POA: Diagnosis not present

## 2015-10-08 DIAGNOSIS — I10 Essential (primary) hypertension: Secondary | ICD-10-CM | POA: Diagnosis not present

## 2015-11-02 ENCOUNTER — Other Ambulatory Visit: Payer: Self-pay

## 2015-12-08 DIAGNOSIS — Z23 Encounter for immunization: Secondary | ICD-10-CM | POA: Diagnosis not present

## 2016-01-04 DIAGNOSIS — H5211 Myopia, right eye: Secondary | ICD-10-CM | POA: Diagnosis not present

## 2016-01-04 DIAGNOSIS — E119 Type 2 diabetes mellitus without complications: Secondary | ICD-10-CM | POA: Diagnosis not present

## 2016-01-04 DIAGNOSIS — H524 Presbyopia: Secondary | ICD-10-CM | POA: Diagnosis not present

## 2016-01-04 DIAGNOSIS — H52203 Unspecified astigmatism, bilateral: Secondary | ICD-10-CM | POA: Diagnosis not present

## 2016-01-04 DIAGNOSIS — H2513 Age-related nuclear cataract, bilateral: Secondary | ICD-10-CM | POA: Diagnosis not present

## 2016-01-26 DIAGNOSIS — L821 Other seborrheic keratosis: Secondary | ICD-10-CM | POA: Diagnosis not present

## 2016-01-26 DIAGNOSIS — Z85828 Personal history of other malignant neoplasm of skin: Secondary | ICD-10-CM | POA: Diagnosis not present

## 2016-01-26 DIAGNOSIS — D692 Other nonthrombocytopenic purpura: Secondary | ICD-10-CM | POA: Diagnosis not present

## 2016-01-26 DIAGNOSIS — D485 Neoplasm of uncertain behavior of skin: Secondary | ICD-10-CM | POA: Diagnosis not present

## 2016-01-26 DIAGNOSIS — D1801 Hemangioma of skin and subcutaneous tissue: Secondary | ICD-10-CM | POA: Diagnosis not present

## 2016-01-26 DIAGNOSIS — L570 Actinic keratosis: Secondary | ICD-10-CM | POA: Diagnosis not present

## 2016-02-03 DIAGNOSIS — E039 Hypothyroidism, unspecified: Secondary | ICD-10-CM | POA: Diagnosis not present

## 2016-02-03 DIAGNOSIS — E782 Mixed hyperlipidemia: Secondary | ICD-10-CM | POA: Diagnosis not present

## 2016-02-03 DIAGNOSIS — E785 Hyperlipidemia, unspecified: Secondary | ICD-10-CM | POA: Diagnosis not present

## 2016-02-03 DIAGNOSIS — E119 Type 2 diabetes mellitus without complications: Secondary | ICD-10-CM | POA: Diagnosis not present

## 2016-02-03 DIAGNOSIS — I10 Essential (primary) hypertension: Secondary | ICD-10-CM | POA: Diagnosis not present

## 2016-02-03 DIAGNOSIS — R7301 Impaired fasting glucose: Secondary | ICD-10-CM | POA: Diagnosis not present

## 2016-02-03 DIAGNOSIS — N529 Male erectile dysfunction, unspecified: Secondary | ICD-10-CM | POA: Diagnosis not present

## 2016-02-03 DIAGNOSIS — I482 Chronic atrial fibrillation: Secondary | ICD-10-CM | POA: Diagnosis not present

## 2016-02-07 DIAGNOSIS — E782 Mixed hyperlipidemia: Secondary | ICD-10-CM | POA: Diagnosis not present

## 2016-02-07 DIAGNOSIS — E119 Type 2 diabetes mellitus without complications: Secondary | ICD-10-CM | POA: Diagnosis not present

## 2016-02-07 DIAGNOSIS — I10 Essential (primary) hypertension: Secondary | ICD-10-CM | POA: Diagnosis not present

## 2016-03-31 DIAGNOSIS — J060 Acute laryngopharyngitis: Secondary | ICD-10-CM | POA: Diagnosis not present

## 2016-03-31 DIAGNOSIS — R05 Cough: Secondary | ICD-10-CM | POA: Diagnosis not present

## 2016-06-20 DIAGNOSIS — I10 Essential (primary) hypertension: Secondary | ICD-10-CM | POA: Diagnosis not present

## 2016-06-20 DIAGNOSIS — R7301 Impaired fasting glucose: Secondary | ICD-10-CM | POA: Diagnosis not present

## 2016-06-23 DIAGNOSIS — N400 Enlarged prostate without lower urinary tract symptoms: Secondary | ICD-10-CM | POA: Diagnosis not present

## 2016-06-23 DIAGNOSIS — R972 Elevated prostate specific antigen [PSA]: Secondary | ICD-10-CM | POA: Diagnosis not present

## 2016-06-23 DIAGNOSIS — E782 Mixed hyperlipidemia: Secondary | ICD-10-CM | POA: Diagnosis not present

## 2016-06-23 DIAGNOSIS — I10 Essential (primary) hypertension: Secondary | ICD-10-CM | POA: Diagnosis not present

## 2016-06-23 DIAGNOSIS — E1140 Type 2 diabetes mellitus with diabetic neuropathy, unspecified: Secondary | ICD-10-CM | POA: Diagnosis not present

## 2016-06-23 DIAGNOSIS — Z6829 Body mass index (BMI) 29.0-29.9, adult: Secondary | ICD-10-CM | POA: Diagnosis not present

## 2016-07-04 DIAGNOSIS — R071 Chest pain on breathing: Secondary | ICD-10-CM | POA: Diagnosis not present

## 2016-07-04 DIAGNOSIS — R079 Chest pain, unspecified: Secondary | ICD-10-CM | POA: Diagnosis not present

## 2016-07-06 DIAGNOSIS — M5033 Other cervical disc degeneration, cervicothoracic region: Secondary | ICD-10-CM | POA: Diagnosis not present

## 2016-07-06 DIAGNOSIS — M9901 Segmental and somatic dysfunction of cervical region: Secondary | ICD-10-CM | POA: Diagnosis not present

## 2016-07-10 DIAGNOSIS — M5033 Other cervical disc degeneration, cervicothoracic region: Secondary | ICD-10-CM | POA: Diagnosis not present

## 2016-07-10 DIAGNOSIS — M9901 Segmental and somatic dysfunction of cervical region: Secondary | ICD-10-CM | POA: Diagnosis not present

## 2016-07-13 DIAGNOSIS — M5033 Other cervical disc degeneration, cervicothoracic region: Secondary | ICD-10-CM | POA: Diagnosis not present

## 2016-07-13 DIAGNOSIS — M9901 Segmental and somatic dysfunction of cervical region: Secondary | ICD-10-CM | POA: Diagnosis not present

## 2016-07-17 DIAGNOSIS — M9901 Segmental and somatic dysfunction of cervical region: Secondary | ICD-10-CM | POA: Diagnosis not present

## 2016-07-17 DIAGNOSIS — M5033 Other cervical disc degeneration, cervicothoracic region: Secondary | ICD-10-CM | POA: Diagnosis not present

## 2016-07-20 DIAGNOSIS — M9901 Segmental and somatic dysfunction of cervical region: Secondary | ICD-10-CM | POA: Diagnosis not present

## 2016-07-20 DIAGNOSIS — M5033 Other cervical disc degeneration, cervicothoracic region: Secondary | ICD-10-CM | POA: Diagnosis not present

## 2016-07-24 DIAGNOSIS — M5033 Other cervical disc degeneration, cervicothoracic region: Secondary | ICD-10-CM | POA: Diagnosis not present

## 2016-07-24 DIAGNOSIS — M9901 Segmental and somatic dysfunction of cervical region: Secondary | ICD-10-CM | POA: Diagnosis not present

## 2016-07-27 DIAGNOSIS — M5033 Other cervical disc degeneration, cervicothoracic region: Secondary | ICD-10-CM | POA: Diagnosis not present

## 2016-07-27 DIAGNOSIS — M9901 Segmental and somatic dysfunction of cervical region: Secondary | ICD-10-CM | POA: Diagnosis not present

## 2016-08-03 DIAGNOSIS — M9901 Segmental and somatic dysfunction of cervical region: Secondary | ICD-10-CM | POA: Diagnosis not present

## 2016-08-03 DIAGNOSIS — M5033 Other cervical disc degeneration, cervicothoracic region: Secondary | ICD-10-CM | POA: Diagnosis not present

## 2016-08-10 DIAGNOSIS — M9901 Segmental and somatic dysfunction of cervical region: Secondary | ICD-10-CM | POA: Diagnosis not present

## 2016-08-10 DIAGNOSIS — M5033 Other cervical disc degeneration, cervicothoracic region: Secondary | ICD-10-CM | POA: Diagnosis not present

## 2016-08-16 DIAGNOSIS — R972 Elevated prostate specific antigen [PSA]: Secondary | ICD-10-CM | POA: Diagnosis not present

## 2016-08-17 DIAGNOSIS — M9901 Segmental and somatic dysfunction of cervical region: Secondary | ICD-10-CM | POA: Diagnosis not present

## 2016-08-17 DIAGNOSIS — M5033 Other cervical disc degeneration, cervicothoracic region: Secondary | ICD-10-CM | POA: Diagnosis not present

## 2016-08-23 DIAGNOSIS — N401 Enlarged prostate with lower urinary tract symptoms: Secondary | ICD-10-CM | POA: Diagnosis not present

## 2016-08-23 DIAGNOSIS — R972 Elevated prostate specific antigen [PSA]: Secondary | ICD-10-CM | POA: Diagnosis not present

## 2016-08-23 DIAGNOSIS — R351 Nocturia: Secondary | ICD-10-CM | POA: Diagnosis not present

## 2016-10-25 DIAGNOSIS — K5730 Diverticulosis of large intestine without perforation or abscess without bleeding: Secondary | ICD-10-CM | POA: Diagnosis not present

## 2016-10-25 DIAGNOSIS — K640 First degree hemorrhoids: Secondary | ICD-10-CM | POA: Diagnosis not present

## 2016-10-25 DIAGNOSIS — Z86010 Personal history of colonic polyps: Secondary | ICD-10-CM | POA: Diagnosis not present

## 2016-12-21 DIAGNOSIS — I10 Essential (primary) hypertension: Secondary | ICD-10-CM | POA: Diagnosis not present

## 2016-12-21 DIAGNOSIS — E1140 Type 2 diabetes mellitus with diabetic neuropathy, unspecified: Secondary | ICD-10-CM | POA: Diagnosis not present

## 2016-12-21 DIAGNOSIS — R972 Elevated prostate specific antigen [PSA]: Secondary | ICD-10-CM | POA: Diagnosis not present

## 2016-12-26 DIAGNOSIS — Z681 Body mass index (BMI) 19 or less, adult: Secondary | ICD-10-CM | POA: Diagnosis not present

## 2016-12-26 DIAGNOSIS — Z23 Encounter for immunization: Secondary | ICD-10-CM | POA: Diagnosis not present

## 2016-12-26 DIAGNOSIS — I10 Essential (primary) hypertension: Secondary | ICD-10-CM | POA: Diagnosis not present

## 2016-12-26 DIAGNOSIS — N401 Enlarged prostate with lower urinary tract symptoms: Secondary | ICD-10-CM | POA: Diagnosis not present

## 2016-12-26 DIAGNOSIS — E790 Hyperuricemia without signs of inflammatory arthritis and tophaceous disease: Secondary | ICD-10-CM | POA: Diagnosis not present

## 2016-12-26 DIAGNOSIS — E782 Mixed hyperlipidemia: Secondary | ICD-10-CM | POA: Diagnosis not present

## 2016-12-26 DIAGNOSIS — R972 Elevated prostate specific antigen [PSA]: Secondary | ICD-10-CM | POA: Diagnosis not present

## 2017-01-30 DIAGNOSIS — H52203 Unspecified astigmatism, bilateral: Secondary | ICD-10-CM | POA: Diagnosis not present

## 2017-01-30 DIAGNOSIS — E119 Type 2 diabetes mellitus without complications: Secondary | ICD-10-CM | POA: Diagnosis not present

## 2017-01-30 DIAGNOSIS — H5213 Myopia, bilateral: Secondary | ICD-10-CM | POA: Diagnosis not present

## 2017-01-30 DIAGNOSIS — Z7984 Long term (current) use of oral hypoglycemic drugs: Secondary | ICD-10-CM | POA: Diagnosis not present

## 2017-01-30 DIAGNOSIS — H2513 Age-related nuclear cataract, bilateral: Secondary | ICD-10-CM | POA: Diagnosis not present

## 2017-01-30 DIAGNOSIS — H524 Presbyopia: Secondary | ICD-10-CM | POA: Diagnosis not present

## 2017-01-31 DIAGNOSIS — D1801 Hemangioma of skin and subcutaneous tissue: Secondary | ICD-10-CM | POA: Diagnosis not present

## 2017-01-31 DIAGNOSIS — Z85828 Personal history of other malignant neoplasm of skin: Secondary | ICD-10-CM | POA: Diagnosis not present

## 2017-01-31 DIAGNOSIS — L570 Actinic keratosis: Secondary | ICD-10-CM | POA: Diagnosis not present

## 2017-01-31 DIAGNOSIS — L821 Other seborrheic keratosis: Secondary | ICD-10-CM | POA: Diagnosis not present

## 2017-04-25 DIAGNOSIS — R972 Elevated prostate specific antigen [PSA]: Secondary | ICD-10-CM | POA: Diagnosis not present

## 2017-04-25 DIAGNOSIS — E1142 Type 2 diabetes mellitus with diabetic polyneuropathy: Secondary | ICD-10-CM | POA: Diagnosis not present

## 2017-04-25 DIAGNOSIS — E782 Mixed hyperlipidemia: Secondary | ICD-10-CM | POA: Diagnosis not present

## 2017-04-25 DIAGNOSIS — I10 Essential (primary) hypertension: Secondary | ICD-10-CM | POA: Diagnosis not present

## 2017-04-25 DIAGNOSIS — R7301 Impaired fasting glucose: Secondary | ICD-10-CM | POA: Diagnosis not present

## 2017-04-27 DIAGNOSIS — E119 Type 2 diabetes mellitus without complications: Secondary | ICD-10-CM | POA: Diagnosis not present

## 2017-04-27 DIAGNOSIS — G629 Polyneuropathy, unspecified: Secondary | ICD-10-CM | POA: Diagnosis not present

## 2017-04-27 DIAGNOSIS — I10 Essential (primary) hypertension: Secondary | ICD-10-CM | POA: Diagnosis not present

## 2017-04-27 DIAGNOSIS — E782 Mixed hyperlipidemia: Secondary | ICD-10-CM | POA: Diagnosis not present

## 2017-04-27 DIAGNOSIS — R972 Elevated prostate specific antigen [PSA]: Secondary | ICD-10-CM | POA: Diagnosis not present

## 2017-04-27 DIAGNOSIS — N401 Enlarged prostate with lower urinary tract symptoms: Secondary | ICD-10-CM | POA: Diagnosis not present

## 2017-04-27 DIAGNOSIS — E790 Hyperuricemia without signs of inflammatory arthritis and tophaceous disease: Secondary | ICD-10-CM | POA: Diagnosis not present

## 2017-04-27 DIAGNOSIS — Z6830 Body mass index (BMI) 30.0-30.9, adult: Secondary | ICD-10-CM | POA: Diagnosis not present

## 2017-04-27 DIAGNOSIS — D696 Thrombocytopenia, unspecified: Secondary | ICD-10-CM | POA: Diagnosis not present

## 2017-08-06 DIAGNOSIS — I10 Essential (primary) hypertension: Secondary | ICD-10-CM | POA: Diagnosis not present

## 2017-08-06 DIAGNOSIS — S30860A Insect bite (nonvenomous) of lower back and pelvis, initial encounter: Secondary | ICD-10-CM | POA: Diagnosis not present

## 2017-08-06 DIAGNOSIS — Z6830 Body mass index (BMI) 30.0-30.9, adult: Secondary | ICD-10-CM | POA: Diagnosis not present

## 2017-08-06 DIAGNOSIS — E782 Mixed hyperlipidemia: Secondary | ICD-10-CM | POA: Diagnosis not present

## 2017-08-23 DIAGNOSIS — N429 Disorder of prostate, unspecified: Secondary | ICD-10-CM | POA: Diagnosis not present

## 2017-08-23 DIAGNOSIS — E119 Type 2 diabetes mellitus without complications: Secondary | ICD-10-CM | POA: Diagnosis not present

## 2017-08-23 DIAGNOSIS — E782 Mixed hyperlipidemia: Secondary | ICD-10-CM | POA: Diagnosis not present

## 2017-08-30 DIAGNOSIS — D696 Thrombocytopenia, unspecified: Secondary | ICD-10-CM | POA: Diagnosis not present

## 2017-08-30 DIAGNOSIS — E1169 Type 2 diabetes mellitus with other specified complication: Secondary | ICD-10-CM | POA: Diagnosis not present

## 2017-08-30 DIAGNOSIS — I10 Essential (primary) hypertension: Secondary | ICD-10-CM | POA: Diagnosis not present

## 2017-08-30 DIAGNOSIS — N400 Enlarged prostate without lower urinary tract symptoms: Secondary | ICD-10-CM | POA: Diagnosis not present

## 2017-08-30 DIAGNOSIS — E782 Mixed hyperlipidemia: Secondary | ICD-10-CM | POA: Diagnosis not present

## 2017-08-30 DIAGNOSIS — Z6830 Body mass index (BMI) 30.0-30.9, adult: Secondary | ICD-10-CM | POA: Diagnosis not present

## 2017-08-30 DIAGNOSIS — E876 Hypokalemia: Secondary | ICD-10-CM | POA: Diagnosis not present

## 2017-08-30 DIAGNOSIS — G9009 Other idiopathic peripheral autonomic neuropathy: Secondary | ICD-10-CM | POA: Diagnosis not present

## 2017-08-30 DIAGNOSIS — E790 Hyperuricemia without signs of inflammatory arthritis and tophaceous disease: Secondary | ICD-10-CM | POA: Diagnosis not present

## 2017-09-03 DIAGNOSIS — H524 Presbyopia: Secondary | ICD-10-CM | POA: Diagnosis not present

## 2017-09-03 DIAGNOSIS — H52203 Unspecified astigmatism, bilateral: Secondary | ICD-10-CM | POA: Diagnosis not present

## 2017-09-03 DIAGNOSIS — H5211 Myopia, right eye: Secondary | ICD-10-CM | POA: Diagnosis not present

## 2017-09-03 DIAGNOSIS — H2513 Age-related nuclear cataract, bilateral: Secondary | ICD-10-CM | POA: Diagnosis not present

## 2017-09-10 DIAGNOSIS — N5201 Erectile dysfunction due to arterial insufficiency: Secondary | ICD-10-CM | POA: Diagnosis not present

## 2017-09-10 DIAGNOSIS — N401 Enlarged prostate with lower urinary tract symptoms: Secondary | ICD-10-CM | POA: Diagnosis not present

## 2017-09-10 DIAGNOSIS — R351 Nocturia: Secondary | ICD-10-CM | POA: Diagnosis not present

## 2017-10-02 DIAGNOSIS — K5900 Constipation, unspecified: Secondary | ICD-10-CM | POA: Diagnosis not present

## 2017-10-02 DIAGNOSIS — R140 Abdominal distension (gaseous): Secondary | ICD-10-CM | POA: Diagnosis not present

## 2017-10-02 DIAGNOSIS — Z6829 Body mass index (BMI) 29.0-29.9, adult: Secondary | ICD-10-CM | POA: Diagnosis not present

## 2017-10-04 ENCOUNTER — Other Ambulatory Visit: Payer: Self-pay

## 2017-11-06 DIAGNOSIS — R197 Diarrhea, unspecified: Secondary | ICD-10-CM | POA: Diagnosis not present

## 2017-11-06 DIAGNOSIS — Z6829 Body mass index (BMI) 29.0-29.9, adult: Secondary | ICD-10-CM | POA: Diagnosis not present

## 2017-11-06 DIAGNOSIS — R1011 Right upper quadrant pain: Secondary | ICD-10-CM | POA: Diagnosis not present

## 2017-11-06 DIAGNOSIS — R140 Abdominal distension (gaseous): Secondary | ICD-10-CM | POA: Diagnosis not present

## 2017-11-07 ENCOUNTER — Other Ambulatory Visit (HOSPITAL_COMMUNITY): Payer: Self-pay | Admitting: Internal Medicine

## 2017-11-07 DIAGNOSIS — R197 Diarrhea, unspecified: Secondary | ICD-10-CM

## 2017-11-07 DIAGNOSIS — R109 Unspecified abdominal pain: Secondary | ICD-10-CM

## 2017-11-09 ENCOUNTER — Ambulatory Visit (HOSPITAL_COMMUNITY)
Admission: RE | Admit: 2017-11-09 | Discharge: 2017-11-09 | Disposition: A | Discharge status: Home / Self Care | Payer: Medicare Other | Source: Ambulatory Visit | Attending: Internal Medicine | Admitting: Internal Medicine

## 2017-11-09 DIAGNOSIS — R109 Unspecified abdominal pain: Secondary | ICD-10-CM | POA: Insufficient documentation

## 2017-11-09 DIAGNOSIS — Q6101 Congenital single renal cyst: Secondary | ICD-10-CM | POA: Diagnosis not present

## 2017-11-09 DIAGNOSIS — K824 Cholesterolosis of gallbladder: Secondary | ICD-10-CM | POA: Insufficient documentation

## 2017-11-09 DIAGNOSIS — R197 Diarrhea, unspecified: Secondary | ICD-10-CM | POA: Diagnosis not present

## 2017-11-14 DIAGNOSIS — K219 Gastro-esophageal reflux disease without esophagitis: Secondary | ICD-10-CM | POA: Diagnosis not present

## 2017-11-14 DIAGNOSIS — R197 Diarrhea, unspecified: Secondary | ICD-10-CM | POA: Diagnosis not present

## 2017-11-14 DIAGNOSIS — E119 Type 2 diabetes mellitus without complications: Secondary | ICD-10-CM | POA: Diagnosis not present

## 2017-11-15 DIAGNOSIS — R197 Diarrhea, unspecified: Secondary | ICD-10-CM | POA: Diagnosis not present

## 2017-11-26 DIAGNOSIS — Z86010 Personal history of colonic polyps: Secondary | ICD-10-CM | POA: Diagnosis not present

## 2017-11-26 DIAGNOSIS — E119 Type 2 diabetes mellitus without complications: Secondary | ICD-10-CM | POA: Diagnosis not present

## 2017-11-26 DIAGNOSIS — K219 Gastro-esophageal reflux disease without esophagitis: Secondary | ICD-10-CM | POA: Diagnosis not present

## 2017-11-26 DIAGNOSIS — Z7984 Long term (current) use of oral hypoglycemic drugs: Secondary | ICD-10-CM | POA: Diagnosis not present

## 2017-12-03 DIAGNOSIS — R634 Abnormal weight loss: Secondary | ICD-10-CM | POA: Diagnosis not present

## 2017-12-03 DIAGNOSIS — R197 Diarrhea, unspecified: Secondary | ICD-10-CM | POA: Diagnosis not present

## 2017-12-04 DIAGNOSIS — R197 Diarrhea, unspecified: Secondary | ICD-10-CM | POA: Diagnosis not present

## 2017-12-19 DIAGNOSIS — R197 Diarrhea, unspecified: Secondary | ICD-10-CM | POA: Diagnosis not present

## 2017-12-19 DIAGNOSIS — K219 Gastro-esophageal reflux disease without esophagitis: Secondary | ICD-10-CM | POA: Diagnosis not present

## 2017-12-31 DIAGNOSIS — E876 Hypokalemia: Secondary | ICD-10-CM | POA: Diagnosis not present

## 2017-12-31 DIAGNOSIS — R972 Elevated prostate specific antigen [PSA]: Secondary | ICD-10-CM | POA: Diagnosis not present

## 2017-12-31 DIAGNOSIS — E782 Mixed hyperlipidemia: Secondary | ICD-10-CM | POA: Diagnosis not present

## 2017-12-31 DIAGNOSIS — E1169 Type 2 diabetes mellitus with other specified complication: Secondary | ICD-10-CM | POA: Diagnosis not present

## 2017-12-31 DIAGNOSIS — Z6830 Body mass index (BMI) 30.0-30.9, adult: Secondary | ICD-10-CM | POA: Diagnosis not present

## 2017-12-31 DIAGNOSIS — I10 Essential (primary) hypertension: Secondary | ICD-10-CM | POA: Diagnosis not present

## 2018-01-04 DIAGNOSIS — D530 Protein deficiency anemia: Secondary | ICD-10-CM | POA: Diagnosis not present

## 2018-01-04 DIAGNOSIS — D649 Anemia, unspecified: Secondary | ICD-10-CM | POA: Diagnosis not present

## 2018-01-04 DIAGNOSIS — R972 Elevated prostate specific antigen [PSA]: Secondary | ICD-10-CM | POA: Diagnosis not present

## 2018-01-04 DIAGNOSIS — D696 Thrombocytopenia, unspecified: Secondary | ICD-10-CM | POA: Diagnosis not present

## 2018-01-04 DIAGNOSIS — I10 Essential (primary) hypertension: Secondary | ICD-10-CM | POA: Diagnosis not present

## 2018-01-04 DIAGNOSIS — N401 Enlarged prostate with lower urinary tract symptoms: Secondary | ICD-10-CM | POA: Diagnosis not present

## 2018-01-04 DIAGNOSIS — E876 Hypokalemia: Secondary | ICD-10-CM | POA: Diagnosis not present

## 2018-01-04 DIAGNOSIS — E1169 Type 2 diabetes mellitus with other specified complication: Secondary | ICD-10-CM | POA: Diagnosis not present

## 2018-01-04 DIAGNOSIS — E782 Mixed hyperlipidemia: Secondary | ICD-10-CM | POA: Diagnosis not present

## 2018-01-04 DIAGNOSIS — R197 Diarrhea, unspecified: Secondary | ICD-10-CM | POA: Diagnosis not present

## 2018-01-04 DIAGNOSIS — E790 Hyperuricemia without signs of inflammatory arthritis and tophaceous disease: Secondary | ICD-10-CM | POA: Diagnosis not present

## 2018-01-04 DIAGNOSIS — Z23 Encounter for immunization: Secondary | ICD-10-CM | POA: Diagnosis not present

## 2018-01-08 DIAGNOSIS — K5730 Diverticulosis of large intestine without perforation or abscess without bleeding: Secondary | ICD-10-CM | POA: Diagnosis not present

## 2018-01-08 DIAGNOSIS — R197 Diarrhea, unspecified: Secondary | ICD-10-CM | POA: Diagnosis not present

## 2018-01-08 DIAGNOSIS — K52831 Collagenous colitis: Secondary | ICD-10-CM | POA: Diagnosis not present

## 2018-01-11 DIAGNOSIS — K52831 Collagenous colitis: Secondary | ICD-10-CM | POA: Diagnosis not present

## 2018-01-29 ENCOUNTER — Other Ambulatory Visit: Payer: Self-pay | Admitting: Gastroenterology

## 2018-01-29 DIAGNOSIS — K52831 Collagenous colitis: Secondary | ICD-10-CM | POA: Diagnosis not present

## 2018-01-29 DIAGNOSIS — Z86010 Personal history of colonic polyps: Secondary | ICD-10-CM | POA: Diagnosis not present

## 2018-01-29 DIAGNOSIS — K219 Gastro-esophageal reflux disease without esophagitis: Secondary | ICD-10-CM | POA: Diagnosis not present

## 2018-01-29 DIAGNOSIS — E119 Type 2 diabetes mellitus without complications: Secondary | ICD-10-CM | POA: Diagnosis not present

## 2018-01-29 DIAGNOSIS — Z7984 Long term (current) use of oral hypoglycemic drugs: Secondary | ICD-10-CM | POA: Diagnosis not present

## 2018-01-29 DIAGNOSIS — R1011 Right upper quadrant pain: Secondary | ICD-10-CM

## 2018-01-30 ENCOUNTER — Ambulatory Visit
Admission: RE | Admit: 2018-01-30 | Discharge: 2018-01-30 | Disposition: A | Discharge status: Home / Self Care | Payer: Medicare Other | Source: Ambulatory Visit | Attending: Gastroenterology | Admitting: Gastroenterology

## 2018-01-30 DIAGNOSIS — R1011 Right upper quadrant pain: Secondary | ICD-10-CM | POA: Diagnosis not present

## 2018-01-30 MED ORDER — IOHEXOL 300 MG/ML  SOLN
100.0000 mL | Freq: Once | INTRAMUSCULAR | Status: AC | PRN
  Administered 2018-01-30: 100 mL via INTRAVENOUS

## 2018-02-12 DIAGNOSIS — H2513 Age-related nuclear cataract, bilateral: Secondary | ICD-10-CM | POA: Diagnosis not present

## 2018-02-12 DIAGNOSIS — Z85828 Personal history of other malignant neoplasm of skin: Secondary | ICD-10-CM | POA: Diagnosis not present

## 2018-02-12 DIAGNOSIS — L570 Actinic keratosis: Secondary | ICD-10-CM | POA: Diagnosis not present

## 2018-02-12 DIAGNOSIS — Z7984 Long term (current) use of oral hypoglycemic drugs: Secondary | ICD-10-CM | POA: Diagnosis not present

## 2018-02-12 DIAGNOSIS — H52203 Unspecified astigmatism, bilateral: Secondary | ICD-10-CM | POA: Diagnosis not present

## 2018-02-12 DIAGNOSIS — D225 Melanocytic nevi of trunk: Secondary | ICD-10-CM | POA: Diagnosis not present

## 2018-02-12 DIAGNOSIS — H524 Presbyopia: Secondary | ICD-10-CM | POA: Diagnosis not present

## 2018-02-12 DIAGNOSIS — H5211 Myopia, right eye: Secondary | ICD-10-CM | POA: Diagnosis not present

## 2018-02-12 DIAGNOSIS — L821 Other seborrheic keratosis: Secondary | ICD-10-CM | POA: Diagnosis not present

## 2018-02-12 DIAGNOSIS — L281 Prurigo nodularis: Secondary | ICD-10-CM | POA: Diagnosis not present

## 2018-02-12 DIAGNOSIS — H25013 Cortical age-related cataract, bilateral: Secondary | ICD-10-CM | POA: Diagnosis not present

## 2018-02-12 DIAGNOSIS — E119 Type 2 diabetes mellitus without complications: Secondary | ICD-10-CM | POA: Diagnosis not present

## 2018-02-18 DIAGNOSIS — K52831 Collagenous colitis: Secondary | ICD-10-CM | POA: Diagnosis not present

## 2018-02-21 DIAGNOSIS — R42 Dizziness and giddiness: Secondary | ICD-10-CM | POA: Diagnosis not present

## 2018-02-21 DIAGNOSIS — I10 Essential (primary) hypertension: Secondary | ICD-10-CM | POA: Diagnosis not present

## 2018-02-21 DIAGNOSIS — Z87442 Personal history of urinary calculi: Secondary | ICD-10-CM | POA: Diagnosis not present

## 2018-02-21 DIAGNOSIS — R8290 Unspecified abnormal findings in urine: Secondary | ICD-10-CM | POA: Diagnosis not present

## 2018-02-21 DIAGNOSIS — K52831 Collagenous colitis: Secondary | ICD-10-CM | POA: Diagnosis not present

## 2018-04-11 DIAGNOSIS — E119 Type 2 diabetes mellitus without complications: Secondary | ICD-10-CM | POA: Diagnosis not present

## 2018-04-11 DIAGNOSIS — I10 Essential (primary) hypertension: Secondary | ICD-10-CM | POA: Diagnosis not present

## 2018-04-11 DIAGNOSIS — R972 Elevated prostate specific antigen [PSA]: Secondary | ICD-10-CM | POA: Diagnosis not present

## 2018-04-11 DIAGNOSIS — E1169 Type 2 diabetes mellitus with other specified complication: Secondary | ICD-10-CM | POA: Diagnosis not present

## 2018-04-11 DIAGNOSIS — E782 Mixed hyperlipidemia: Secondary | ICD-10-CM | POA: Diagnosis not present

## 2018-04-18 DIAGNOSIS — R972 Elevated prostate specific antigen [PSA]: Secondary | ICD-10-CM | POA: Diagnosis not present

## 2018-04-18 DIAGNOSIS — E1169 Type 2 diabetes mellitus with other specified complication: Secondary | ICD-10-CM | POA: Diagnosis not present

## 2018-04-18 DIAGNOSIS — K52831 Collagenous colitis: Secondary | ICD-10-CM | POA: Diagnosis not present

## 2018-04-18 DIAGNOSIS — I10 Essential (primary) hypertension: Secondary | ICD-10-CM | POA: Diagnosis not present

## 2018-04-18 DIAGNOSIS — D530 Protein deficiency anemia: Secondary | ICD-10-CM | POA: Diagnosis not present

## 2018-04-18 DIAGNOSIS — D649 Anemia, unspecified: Secondary | ICD-10-CM | POA: Diagnosis not present

## 2018-04-18 DIAGNOSIS — D696 Thrombocytopenia, unspecified: Secondary | ICD-10-CM | POA: Diagnosis not present

## 2018-04-18 DIAGNOSIS — E876 Hypokalemia: Secondary | ICD-10-CM | POA: Diagnosis not present

## 2018-04-18 DIAGNOSIS — E790 Hyperuricemia without signs of inflammatory arthritis and tophaceous disease: Secondary | ICD-10-CM | POA: Diagnosis not present

## 2018-04-18 DIAGNOSIS — E782 Mixed hyperlipidemia: Secondary | ICD-10-CM | POA: Diagnosis not present

## 2018-04-18 DIAGNOSIS — N400 Enlarged prostate without lower urinary tract symptoms: Secondary | ICD-10-CM | POA: Diagnosis not present

## 2018-04-18 DIAGNOSIS — R197 Diarrhea, unspecified: Secondary | ICD-10-CM | POA: Diagnosis not present

## 2018-07-17 DIAGNOSIS — Z0000 Encounter for general adult medical examination without abnormal findings: Secondary | ICD-10-CM | POA: Diagnosis not present

## 2018-09-16 DIAGNOSIS — E1169 Type 2 diabetes mellitus with other specified complication: Secondary | ICD-10-CM | POA: Diagnosis not present

## 2018-09-16 DIAGNOSIS — H6990 Unspecified Eustachian tube disorder, unspecified ear: Secondary | ICD-10-CM | POA: Diagnosis not present

## 2018-09-16 DIAGNOSIS — I10 Essential (primary) hypertension: Secondary | ICD-10-CM | POA: Diagnosis not present

## 2018-09-17 DIAGNOSIS — R350 Frequency of micturition: Secondary | ICD-10-CM | POA: Diagnosis not present

## 2018-09-17 DIAGNOSIS — N401 Enlarged prostate with lower urinary tract symptoms: Secondary | ICD-10-CM | POA: Diagnosis not present

## 2018-10-01 DIAGNOSIS — K52831 Collagenous colitis: Secondary | ICD-10-CM | POA: Diagnosis not present

## 2018-10-01 DIAGNOSIS — Z86010 Personal history of colonic polyps: Secondary | ICD-10-CM | POA: Diagnosis not present

## 2018-10-17 DIAGNOSIS — I10 Essential (primary) hypertension: Secondary | ICD-10-CM | POA: Diagnosis not present

## 2018-10-17 DIAGNOSIS — E1169 Type 2 diabetes mellitus with other specified complication: Secondary | ICD-10-CM | POA: Diagnosis not present

## 2018-10-17 DIAGNOSIS — Z6830 Body mass index (BMI) 30.0-30.9, adult: Secondary | ICD-10-CM | POA: Diagnosis not present

## 2018-10-17 DIAGNOSIS — E782 Mixed hyperlipidemia: Secondary | ICD-10-CM | POA: Diagnosis not present

## 2018-10-17 DIAGNOSIS — D649 Anemia, unspecified: Secondary | ICD-10-CM | POA: Diagnosis not present

## 2018-10-17 DIAGNOSIS — E119 Type 2 diabetes mellitus without complications: Secondary | ICD-10-CM | POA: Diagnosis not present

## 2018-10-21 DIAGNOSIS — I10 Essential (primary) hypertension: Secondary | ICD-10-CM | POA: Diagnosis not present

## 2018-10-21 DIAGNOSIS — N400 Enlarged prostate without lower urinary tract symptoms: Secondary | ICD-10-CM | POA: Diagnosis not present

## 2018-10-21 DIAGNOSIS — D530 Protein deficiency anemia: Secondary | ICD-10-CM | POA: Diagnosis not present

## 2018-10-21 DIAGNOSIS — E790 Hyperuricemia without signs of inflammatory arthritis and tophaceous disease: Secondary | ICD-10-CM | POA: Diagnosis not present

## 2018-10-21 DIAGNOSIS — K52831 Collagenous colitis: Secondary | ICD-10-CM | POA: Diagnosis not present

## 2018-10-21 DIAGNOSIS — D696 Thrombocytopenia, unspecified: Secondary | ICD-10-CM | POA: Diagnosis not present

## 2018-10-21 DIAGNOSIS — G9009 Other idiopathic peripheral autonomic neuropathy: Secondary | ICD-10-CM | POA: Diagnosis not present

## 2018-10-21 DIAGNOSIS — R972 Elevated prostate specific antigen [PSA]: Secondary | ICD-10-CM | POA: Diagnosis not present

## 2018-10-21 DIAGNOSIS — D649 Anemia, unspecified: Secondary | ICD-10-CM | POA: Diagnosis not present

## 2018-10-21 DIAGNOSIS — E876 Hypokalemia: Secondary | ICD-10-CM | POA: Diagnosis not present

## 2018-10-21 DIAGNOSIS — E1169 Type 2 diabetes mellitus with other specified complication: Secondary | ICD-10-CM | POA: Diagnosis not present

## 2018-10-21 DIAGNOSIS — E782 Mixed hyperlipidemia: Secondary | ICD-10-CM | POA: Diagnosis not present

## 2018-11-18 DIAGNOSIS — H10501 Unspecified blepharoconjunctivitis, right eye: Secondary | ICD-10-CM | POA: Diagnosis not present

## 2018-11-28 DIAGNOSIS — D696 Thrombocytopenia, unspecified: Secondary | ICD-10-CM | POA: Diagnosis not present

## 2018-11-28 DIAGNOSIS — K52831 Collagenous colitis: Secondary | ICD-10-CM | POA: Diagnosis not present

## 2018-11-28 DIAGNOSIS — N400 Enlarged prostate without lower urinary tract symptoms: Secondary | ICD-10-CM | POA: Diagnosis not present

## 2018-11-28 DIAGNOSIS — D530 Protein deficiency anemia: Secondary | ICD-10-CM | POA: Diagnosis not present

## 2018-11-28 DIAGNOSIS — R972 Elevated prostate specific antigen [PSA]: Secondary | ICD-10-CM | POA: Diagnosis not present

## 2018-11-28 DIAGNOSIS — E790 Hyperuricemia without signs of inflammatory arthritis and tophaceous disease: Secondary | ICD-10-CM | POA: Diagnosis not present

## 2018-11-28 DIAGNOSIS — E1169 Type 2 diabetes mellitus with other specified complication: Secondary | ICD-10-CM | POA: Diagnosis not present

## 2018-11-28 DIAGNOSIS — E876 Hypokalemia: Secondary | ICD-10-CM | POA: Diagnosis not present

## 2018-11-28 DIAGNOSIS — D649 Anemia, unspecified: Secondary | ICD-10-CM | POA: Diagnosis not present

## 2018-11-28 DIAGNOSIS — E782 Mixed hyperlipidemia: Secondary | ICD-10-CM | POA: Diagnosis not present

## 2018-11-28 DIAGNOSIS — I10 Essential (primary) hypertension: Secondary | ICD-10-CM | POA: Diagnosis not present

## 2018-12-09 DIAGNOSIS — E1169 Type 2 diabetes mellitus with other specified complication: Secondary | ICD-10-CM | POA: Diagnosis not present

## 2018-12-09 DIAGNOSIS — D530 Protein deficiency anemia: Secondary | ICD-10-CM | POA: Diagnosis not present

## 2018-12-09 DIAGNOSIS — E876 Hypokalemia: Secondary | ICD-10-CM | POA: Diagnosis not present

## 2018-12-09 DIAGNOSIS — E790 Hyperuricemia without signs of inflammatory arthritis and tophaceous disease: Secondary | ICD-10-CM | POA: Diagnosis not present

## 2018-12-09 DIAGNOSIS — R972 Elevated prostate specific antigen [PSA]: Secondary | ICD-10-CM | POA: Diagnosis not present

## 2018-12-09 DIAGNOSIS — I10 Essential (primary) hypertension: Secondary | ICD-10-CM | POA: Diagnosis not present

## 2018-12-09 DIAGNOSIS — D696 Thrombocytopenia, unspecified: Secondary | ICD-10-CM | POA: Diagnosis not present

## 2018-12-09 DIAGNOSIS — D649 Anemia, unspecified: Secondary | ICD-10-CM | POA: Diagnosis not present

## 2018-12-09 DIAGNOSIS — K52831 Collagenous colitis: Secondary | ICD-10-CM | POA: Diagnosis not present

## 2018-12-09 DIAGNOSIS — E782 Mixed hyperlipidemia: Secondary | ICD-10-CM | POA: Diagnosis not present

## 2018-12-09 DIAGNOSIS — N400 Enlarged prostate without lower urinary tract symptoms: Secondary | ICD-10-CM | POA: Diagnosis not present

## 2018-12-30 DIAGNOSIS — R42 Dizziness and giddiness: Secondary | ICD-10-CM | POA: Diagnosis not present

## 2018-12-30 DIAGNOSIS — H903 Sensorineural hearing loss, bilateral: Secondary | ICD-10-CM | POA: Diagnosis not present

## 2018-12-30 DIAGNOSIS — H9042 Sensorineural hearing loss, unilateral, left ear, with unrestricted hearing on the contralateral side: Secondary | ICD-10-CM | POA: Diagnosis not present

## 2019-01-07 DIAGNOSIS — E1169 Type 2 diabetes mellitus with other specified complication: Secondary | ICD-10-CM | POA: Diagnosis not present

## 2019-01-07 DIAGNOSIS — I10 Essential (primary) hypertension: Secondary | ICD-10-CM | POA: Diagnosis not present

## 2019-01-07 DIAGNOSIS — E782 Mixed hyperlipidemia: Secondary | ICD-10-CM | POA: Diagnosis not present

## 2019-01-07 DIAGNOSIS — E876 Hypokalemia: Secondary | ICD-10-CM | POA: Diagnosis not present

## 2019-01-07 DIAGNOSIS — R972 Elevated prostate specific antigen [PSA]: Secondary | ICD-10-CM | POA: Diagnosis not present

## 2019-01-07 DIAGNOSIS — D696 Thrombocytopenia, unspecified: Secondary | ICD-10-CM | POA: Diagnosis not present

## 2019-01-07 DIAGNOSIS — D530 Protein deficiency anemia: Secondary | ICD-10-CM | POA: Diagnosis not present

## 2019-01-07 DIAGNOSIS — E790 Hyperuricemia without signs of inflammatory arthritis and tophaceous disease: Secondary | ICD-10-CM | POA: Diagnosis not present

## 2019-01-07 DIAGNOSIS — D649 Anemia, unspecified: Secondary | ICD-10-CM | POA: Diagnosis not present

## 2019-01-07 DIAGNOSIS — N400 Enlarged prostate without lower urinary tract symptoms: Secondary | ICD-10-CM | POA: Diagnosis not present

## 2019-01-24 DIAGNOSIS — I10 Essential (primary) hypertension: Secondary | ICD-10-CM | POA: Diagnosis not present

## 2019-01-24 DIAGNOSIS — E1169 Type 2 diabetes mellitus with other specified complication: Secondary | ICD-10-CM | POA: Diagnosis not present

## 2019-01-24 DIAGNOSIS — E782 Mixed hyperlipidemia: Secondary | ICD-10-CM | POA: Diagnosis not present

## 2019-01-24 DIAGNOSIS — E119 Type 2 diabetes mellitus without complications: Secondary | ICD-10-CM | POA: Diagnosis not present

## 2019-01-24 DIAGNOSIS — D649 Anemia, unspecified: Secondary | ICD-10-CM | POA: Diagnosis not present

## 2019-01-29 ENCOUNTER — Other Ambulatory Visit: Payer: Self-pay

## 2019-01-29 DIAGNOSIS — E1169 Type 2 diabetes mellitus with other specified complication: Secondary | ICD-10-CM | POA: Diagnosis not present

## 2019-01-29 DIAGNOSIS — N400 Enlarged prostate without lower urinary tract symptoms: Secondary | ICD-10-CM | POA: Diagnosis not present

## 2019-01-29 DIAGNOSIS — E790 Hyperuricemia without signs of inflammatory arthritis and tophaceous disease: Secondary | ICD-10-CM | POA: Diagnosis not present

## 2019-01-29 DIAGNOSIS — D649 Anemia, unspecified: Secondary | ICD-10-CM | POA: Diagnosis not present

## 2019-01-29 DIAGNOSIS — I10 Essential (primary) hypertension: Secondary | ICD-10-CM | POA: Diagnosis not present

## 2019-01-29 DIAGNOSIS — R972 Elevated prostate specific antigen [PSA]: Secondary | ICD-10-CM | POA: Diagnosis not present

## 2019-01-29 DIAGNOSIS — E876 Hypokalemia: Secondary | ICD-10-CM | POA: Diagnosis not present

## 2019-01-29 DIAGNOSIS — D696 Thrombocytopenia, unspecified: Secondary | ICD-10-CM | POA: Diagnosis not present

## 2019-01-29 DIAGNOSIS — G9009 Other idiopathic peripheral autonomic neuropathy: Secondary | ICD-10-CM | POA: Diagnosis not present

## 2019-01-29 DIAGNOSIS — D530 Protein deficiency anemia: Secondary | ICD-10-CM | POA: Diagnosis not present

## 2019-01-29 DIAGNOSIS — E782 Mixed hyperlipidemia: Secondary | ICD-10-CM | POA: Diagnosis not present

## 2019-01-29 DIAGNOSIS — K52831 Collagenous colitis: Secondary | ICD-10-CM | POA: Diagnosis not present

## 2019-02-14 DIAGNOSIS — H25813 Combined forms of age-related cataract, bilateral: Secondary | ICD-10-CM | POA: Diagnosis not present

## 2019-02-14 DIAGNOSIS — E119 Type 2 diabetes mellitus without complications: Secondary | ICD-10-CM | POA: Diagnosis not present

## 2019-03-20 DIAGNOSIS — Z23 Encounter for immunization: Secondary | ICD-10-CM | POA: Diagnosis not present

## 2019-04-02 DIAGNOSIS — D649 Anemia, unspecified: Secondary | ICD-10-CM | POA: Diagnosis not present

## 2019-04-02 DIAGNOSIS — G9009 Other idiopathic peripheral autonomic neuropathy: Secondary | ICD-10-CM | POA: Diagnosis not present

## 2019-04-02 DIAGNOSIS — E1169 Type 2 diabetes mellitus with other specified complication: Secondary | ICD-10-CM | POA: Diagnosis not present

## 2019-04-02 DIAGNOSIS — E7849 Other hyperlipidemia: Secondary | ICD-10-CM | POA: Diagnosis not present

## 2019-04-02 DIAGNOSIS — D530 Protein deficiency anemia: Secondary | ICD-10-CM | POA: Diagnosis not present

## 2019-04-02 DIAGNOSIS — E790 Hyperuricemia without signs of inflammatory arthritis and tophaceous disease: Secondary | ICD-10-CM | POA: Diagnosis not present

## 2019-04-02 DIAGNOSIS — K52831 Collagenous colitis: Secondary | ICD-10-CM | POA: Diagnosis not present

## 2019-04-02 DIAGNOSIS — R972 Elevated prostate specific antigen [PSA]: Secondary | ICD-10-CM | POA: Diagnosis not present

## 2019-04-02 DIAGNOSIS — N400 Enlarged prostate without lower urinary tract symptoms: Secondary | ICD-10-CM | POA: Diagnosis not present

## 2019-04-02 DIAGNOSIS — D696 Thrombocytopenia, unspecified: Secondary | ICD-10-CM | POA: Diagnosis not present

## 2019-04-02 DIAGNOSIS — I10 Essential (primary) hypertension: Secondary | ICD-10-CM | POA: Diagnosis not present

## 2019-04-02 DIAGNOSIS — E876 Hypokalemia: Secondary | ICD-10-CM | POA: Diagnosis not present

## 2019-04-19 DIAGNOSIS — Z23 Encounter for immunization: Secondary | ICD-10-CM | POA: Diagnosis not present

## 2019-04-23 DIAGNOSIS — K52831 Collagenous colitis: Secondary | ICD-10-CM | POA: Diagnosis not present

## 2019-04-23 DIAGNOSIS — R42 Dizziness and giddiness: Secondary | ICD-10-CM | POA: Diagnosis not present

## 2019-04-23 DIAGNOSIS — D649 Anemia, unspecified: Secondary | ICD-10-CM | POA: Diagnosis not present

## 2019-04-23 DIAGNOSIS — I10 Essential (primary) hypertension: Secondary | ICD-10-CM | POA: Diagnosis not present

## 2019-04-23 DIAGNOSIS — R972 Elevated prostate specific antigen [PSA]: Secondary | ICD-10-CM | POA: Diagnosis not present

## 2019-04-23 DIAGNOSIS — E790 Hyperuricemia without signs of inflammatory arthritis and tophaceous disease: Secondary | ICD-10-CM | POA: Diagnosis not present

## 2019-04-23 DIAGNOSIS — G629 Polyneuropathy, unspecified: Secondary | ICD-10-CM | POA: Diagnosis not present

## 2019-04-23 DIAGNOSIS — H6990 Unspecified Eustachian tube disorder, unspecified ear: Secondary | ICD-10-CM | POA: Diagnosis not present

## 2019-04-23 DIAGNOSIS — E119 Type 2 diabetes mellitus without complications: Secondary | ICD-10-CM | POA: Diagnosis not present

## 2019-04-23 DIAGNOSIS — N401 Enlarged prostate with lower urinary tract symptoms: Secondary | ICD-10-CM | POA: Diagnosis not present

## 2019-04-23 DIAGNOSIS — E782 Mixed hyperlipidemia: Secondary | ICD-10-CM | POA: Diagnosis not present

## 2019-04-23 DIAGNOSIS — D530 Protein deficiency anemia: Secondary | ICD-10-CM | POA: Diagnosis not present

## 2019-04-29 DIAGNOSIS — G9009 Other idiopathic peripheral autonomic neuropathy: Secondary | ICD-10-CM | POA: Diagnosis not present

## 2019-04-29 DIAGNOSIS — N400 Enlarged prostate without lower urinary tract symptoms: Secondary | ICD-10-CM | POA: Diagnosis not present

## 2019-04-29 DIAGNOSIS — K52831 Collagenous colitis: Secondary | ICD-10-CM | POA: Diagnosis not present

## 2019-04-29 DIAGNOSIS — E790 Hyperuricemia without signs of inflammatory arthritis and tophaceous disease: Secondary | ICD-10-CM | POA: Diagnosis not present

## 2019-04-29 DIAGNOSIS — E1169 Type 2 diabetes mellitus with other specified complication: Secondary | ICD-10-CM | POA: Diagnosis not present

## 2019-04-29 DIAGNOSIS — D530 Protein deficiency anemia: Secondary | ICD-10-CM | POA: Diagnosis not present

## 2019-04-29 DIAGNOSIS — D696 Thrombocytopenia, unspecified: Secondary | ICD-10-CM | POA: Diagnosis not present

## 2019-04-29 DIAGNOSIS — D649 Anemia, unspecified: Secondary | ICD-10-CM | POA: Diagnosis not present

## 2019-04-29 DIAGNOSIS — E876 Hypokalemia: Secondary | ICD-10-CM | POA: Diagnosis not present

## 2019-04-29 DIAGNOSIS — E782 Mixed hyperlipidemia: Secondary | ICD-10-CM | POA: Diagnosis not present

## 2019-04-29 DIAGNOSIS — I10 Essential (primary) hypertension: Secondary | ICD-10-CM | POA: Diagnosis not present

## 2019-04-29 DIAGNOSIS — R972 Elevated prostate specific antigen [PSA]: Secondary | ICD-10-CM | POA: Diagnosis not present

## 2019-06-19 DIAGNOSIS — D485 Neoplasm of uncertain behavior of skin: Secondary | ICD-10-CM | POA: Diagnosis not present

## 2019-06-19 DIAGNOSIS — L570 Actinic keratosis: Secondary | ICD-10-CM | POA: Diagnosis not present

## 2019-06-19 DIAGNOSIS — L821 Other seborrheic keratosis: Secondary | ICD-10-CM | POA: Diagnosis not present

## 2019-06-19 DIAGNOSIS — D1801 Hemangioma of skin and subcutaneous tissue: Secondary | ICD-10-CM | POA: Diagnosis not present

## 2019-06-19 DIAGNOSIS — Z85828 Personal history of other malignant neoplasm of skin: Secondary | ICD-10-CM | POA: Diagnosis not present

## 2019-08-05 DIAGNOSIS — K625 Hemorrhage of anus and rectum: Secondary | ICD-10-CM | POA: Diagnosis not present

## 2019-08-13 DIAGNOSIS — K649 Unspecified hemorrhoids: Secondary | ICD-10-CM | POA: Diagnosis not present

## 2019-08-13 DIAGNOSIS — K6289 Other specified diseases of anus and rectum: Secondary | ICD-10-CM | POA: Diagnosis not present

## 2019-08-13 DIAGNOSIS — K219 Gastro-esophageal reflux disease without esophagitis: Secondary | ICD-10-CM | POA: Diagnosis not present

## 2019-08-13 DIAGNOSIS — K625 Hemorrhage of anus and rectum: Secondary | ICD-10-CM | POA: Diagnosis not present

## 2019-08-30 ENCOUNTER — Other Ambulatory Visit: Payer: Self-pay

## 2019-08-30 ENCOUNTER — Emergency Department (HOSPITAL_COMMUNITY): Payer: Medicare Other

## 2019-08-30 ENCOUNTER — Encounter (HOSPITAL_COMMUNITY): Payer: Self-pay

## 2019-08-30 ENCOUNTER — Inpatient Hospital Stay (HOSPITAL_COMMUNITY)
Admit: 2019-08-30 | Discharge: 2019-09-07 | DRG: 375 | Disposition: A | Discharge status: Home Health | Payer: Medicare Other | Attending: Internal Medicine | Admitting: Internal Medicine

## 2019-08-30 DIAGNOSIS — K625 Hemorrhage of anus and rectum: Secondary | ICD-10-CM | POA: Diagnosis not present

## 2019-08-30 DIAGNOSIS — Z20822 Contact with and (suspected) exposure to covid-19: Secondary | ICD-10-CM | POA: Diagnosis present

## 2019-08-30 DIAGNOSIS — C787 Secondary malignant neoplasm of liver and intrahepatic bile duct: Secondary | ICD-10-CM | POA: Diagnosis not present

## 2019-08-30 DIAGNOSIS — E785 Hyperlipidemia, unspecified: Secondary | ICD-10-CM | POA: Diagnosis present

## 2019-08-30 DIAGNOSIS — J309 Allergic rhinitis, unspecified: Secondary | ICD-10-CM | POA: Diagnosis not present

## 2019-08-30 DIAGNOSIS — Z7982 Long term (current) use of aspirin: Secondary | ICD-10-CM

## 2019-08-30 DIAGNOSIS — D62 Acute posthemorrhagic anemia: Secondary | ICD-10-CM | POA: Diagnosis not present

## 2019-08-30 DIAGNOSIS — K635 Polyp of colon: Secondary | ICD-10-CM | POA: Diagnosis not present

## 2019-08-30 DIAGNOSIS — N400 Enlarged prostate without lower urinary tract symptoms: Secondary | ICD-10-CM | POA: Diagnosis present

## 2019-08-30 DIAGNOSIS — C20 Malignant neoplasm of rectum: Principal | ICD-10-CM | POA: Diagnosis present

## 2019-08-30 DIAGNOSIS — Z79899 Other long term (current) drug therapy: Secondary | ICD-10-CM | POA: Diagnosis not present

## 2019-08-30 DIAGNOSIS — I10 Essential (primary) hypertension: Secondary | ICD-10-CM | POA: Diagnosis present

## 2019-08-30 DIAGNOSIS — E876 Hypokalemia: Secondary | ICD-10-CM | POA: Diagnosis present

## 2019-08-30 DIAGNOSIS — M1A9XX1 Chronic gout, unspecified, with tophus (tophi): Secondary | ICD-10-CM

## 2019-08-30 DIAGNOSIS — K769 Liver disease, unspecified: Secondary | ICD-10-CM | POA: Diagnosis present

## 2019-08-30 DIAGNOSIS — R160 Hepatomegaly, not elsewhere classified: Secondary | ICD-10-CM

## 2019-08-30 DIAGNOSIS — K219 Gastro-esophageal reflux disease without esophagitis: Secondary | ICD-10-CM | POA: Diagnosis present

## 2019-08-30 DIAGNOSIS — M109 Gout, unspecified: Secondary | ICD-10-CM | POA: Diagnosis not present

## 2019-08-30 HISTORY — DX: Hyperlipidemia, unspecified: E78.5

## 2019-08-30 LAB — PROTIME-INR
INR: 1 (ref 0.8–1.2)
Prothrombin Time: 12.3 seconds (ref 11.4–15.2)

## 2019-08-30 LAB — COMPREHENSIVE METABOLIC PANEL
ALT: 21 U/L (ref 0–44)
AST: 21 U/L (ref 15–41)
Albumin: 4 g/dL (ref 3.5–5.0)
Alkaline Phosphatase: 53 U/L (ref 38–126)
Anion gap: 12 (ref 5–15)
BUN: 15 mg/dL (ref 8–23)
CO2: 27 mmol/L (ref 22–32)
Calcium: 8.9 mg/dL (ref 8.9–10.3)
Chloride: 99 mmol/L (ref 98–111)
Creatinine, Ser: 0.92 mg/dL (ref 0.61–1.24)
GFR calc Af Amer: >60 mL/min (ref >60)
GFR calc non Af Amer: >60 mL/min (ref >60)
Glucose, Bld: 121 mg/dL — ABNORMAL HIGH (ref 70–99)
Potassium: 3.2 mmol/L — ABNORMAL LOW (ref 3.5–5.1)
Sodium: 138 mmol/L (ref 135–145)
Total Bilirubin: 0.8 mg/dL (ref 0.3–1.2)
Total Protein: 6.3 g/dL — ABNORMAL LOW (ref 6.5–8.1)

## 2019-08-30 LAB — CBC WITH DIFFERENTIAL/PLATELET
Abs Immature Granulocytes: 0.03 10*3/uL (ref 0.00–0.07)
Basophils Absolute: 0 10*3/uL (ref 0.0–0.1)
Basophils Relative: 1 %
Eosinophils Absolute: 0.1 10*3/uL (ref 0.0–0.5)
Eosinophils Relative: 2 %
HCT: 45.3 % (ref 39.0–52.0)
Hemoglobin: 15.1 g/dL (ref 13.0–17.0)
Immature Granulocytes: 1 %
Lymphocytes Relative: 14 %
Lymphs Abs: 0.7 10*3/uL (ref 0.7–4.0)
MCH: 32.1 pg (ref 26.0–34.0)
MCHC: 33.3 g/dL (ref 30.0–36.0)
MCV: 96.2 fL (ref 80.0–100.0)
Monocytes Absolute: 0.6 10*3/uL (ref 0.1–1.0)
Monocytes Relative: 11 %
Neutro Abs: 3.7 10*3/uL (ref 1.7–7.7)
Neutrophils Relative %: 71 %
Platelets: 162 10*3/uL (ref 150–400)
RBC: 4.71 MIL/uL (ref 4.22–5.81)
RDW: 13 % (ref 11.5–15.5)
WBC: 5.2 10*3/uL (ref 4.0–10.5)
nRBC: 0 % (ref 0.0–0.2)

## 2019-08-30 LAB — SARS CORONAVIRUS 2 BY RT PCR (HOSPITAL ORDER, PERFORMED IN ~~LOC~~ HOSPITAL LAB): SARS Coronavirus 2: NEGATIVE

## 2019-08-30 LAB — TYPE AND SCREEN
ABO/RH(D): O POS
ABO/RH(D): O POS
Antibody Screen: NEGATIVE
Antibody Screen: NEGATIVE

## 2019-08-30 LAB — CBC
HCT: 40.7 % (ref 39.0–52.0)
Hemoglobin: 13.4 g/dL (ref 13.0–17.0)
MCH: 31.9 pg (ref 26.0–34.0)
MCHC: 32.9 g/dL (ref 30.0–36.0)
MCV: 96.9 fL (ref 80.0–100.0)
Platelets: 148 10*3/uL — ABNORMAL LOW (ref 150–400)
RBC: 4.2 MIL/uL — ABNORMAL LOW (ref 4.22–5.81)
RDW: 13.1 % (ref 11.5–15.5)
WBC: 5.4 10*3/uL (ref 4.0–10.5)
nRBC: 0 % (ref 0.0–0.2)

## 2019-08-30 LAB — APTT: aPTT: 26 seconds (ref 24–36)

## 2019-08-30 LAB — SAMPLE TO BLOOD BANK

## 2019-08-30 LAB — ABO/RH: ABO/RH(D): O POS

## 2019-08-30 MED ORDER — IOHEXOL 300 MG/ML  SOLN
100.0000 mL | Freq: Once | INTRAMUSCULAR | Status: AC | PRN
  Administered 2019-08-30: 100 mL via INTRAVENOUS

## 2019-08-30 MED ORDER — POTASSIUM CHLORIDE CRYS ER 20 MEQ PO TBCR
40.0000 meq | EXTENDED_RELEASE_TABLET | Freq: Once | ORAL | Status: AC
  Administered 2019-08-30: 40 meq via ORAL
  Filled 2019-08-30: qty 2

## 2019-08-30 MED ORDER — ACETAMINOPHEN 325 MG PO TABS: 650.0000 mg | ORAL_TABLET | Freq: Four times a day (QID) | ORAL | Status: DC | PRN

## 2019-08-30 MED ORDER — LORATADINE 10 MG PO TABS
10.0000 mg | ORAL_TABLET | Freq: Every day | ORAL | Status: DC
  Administered 2019-09-01 – 2019-09-07 (×6): 10 mg via ORAL
  Filled 2019-08-30 (×9): qty 1

## 2019-08-30 MED ORDER — SIMVASTATIN 20 MG PO TABS
40.0000 mg | ORAL_TABLET | Freq: Every day | ORAL | Status: DC
  Administered 2019-08-30 – 2019-09-06 (×8): 40 mg via ORAL
  Filled 2019-08-30 (×8): qty 2

## 2019-08-30 MED ORDER — ONDANSETRON HCL 4 MG PO TABS: 4.0000 mg | ORAL_TABLET | Freq: Four times a day (QID) | ORAL | Status: DC | PRN

## 2019-08-30 MED ORDER — BUDESONIDE 3 MG PO CPEP
3.0000 mg | ORAL_CAPSULE | Freq: Every day | ORAL | Status: DC
  Filled 2019-08-30 (×6): qty 1

## 2019-08-30 MED ORDER — SODIUM CHLORIDE 0.9 % IV SOLN: INTRAVENOUS | Status: AC

## 2019-08-30 MED ORDER — FINASTERIDE 5 MG PO TABS
5.0000 mg | ORAL_TABLET | Freq: Every day | ORAL | Status: DC
  Administered 2019-08-30 – 2019-09-07 (×9): 5 mg via ORAL
  Filled 2019-08-30 (×11): qty 1

## 2019-08-30 MED ORDER — TRIAMTERENE-HCTZ 75-50 MG PO TABS
1.0000 | ORAL_TABLET | Freq: Every day | ORAL | Status: DC
  Administered 2019-08-31 – 2019-09-02 (×3): 1 via ORAL
  Filled 2019-08-30 (×5): qty 1

## 2019-08-30 MED ORDER — POLYETHYLENE GLYCOL 3350 17 G PO PACK: 17.0000 g | PACK | Freq: Every day | ORAL | Status: DC | PRN

## 2019-08-30 MED ORDER — DOXAZOSIN MESYLATE 2 MG PO TABS
8.0000 mg | ORAL_TABLET | Freq: Every day | ORAL | Status: DC
  Administered 2019-08-30 – 2019-09-05 (×7): 8 mg via ORAL
  Filled 2019-08-30 (×6): qty 4
  Filled 2019-08-30: qty 1
  Filled 2019-08-30: qty 4

## 2019-08-30 MED ORDER — ONDANSETRON HCL 4 MG/2ML IJ SOLN
4.0000 mg | Freq: Four times a day (QID) | INTRAMUSCULAR | Status: DC | PRN
  Administered 2019-09-03: 4 mg via INTRAVENOUS
  Filled 2019-08-30: qty 2

## 2019-08-30 MED ORDER — ACETAMINOPHEN 650 MG RE SUPP: 650.0000 mg | Freq: Four times a day (QID) | RECTAL | Status: DC | PRN

## 2019-08-30 MED ORDER — ALLOPURINOL 100 MG PO TABS
100.0000 mg | ORAL_TABLET | Freq: Every day | ORAL | Status: DC
  Administered 2019-08-30 – 2019-09-07 (×9): 100 mg via ORAL
  Filled 2019-08-30 (×11): qty 1

## 2019-08-30 MED ORDER — SODIUM CHLORIDE 0.9 % IV BOLUS
500.0000 mL | Freq: Once | INTRAVENOUS | Status: AC
  Administered 2019-08-30: 500 mL via INTRAVENOUS

## 2019-08-30 MED ORDER — PANTOPRAZOLE SODIUM 40 MG PO TBEC
40.0000 mg | DELAYED_RELEASE_TABLET | Freq: Every day | ORAL | Status: DC
  Administered 2019-08-30 – 2019-09-07 (×9): 40 mg via ORAL
  Filled 2019-08-30 (×9): qty 1

## 2019-08-30 NOTE — ED Provider Notes (Signed)
[facility] Provider Note   CSN: [id number] Arrival date & time: 08/30/19  0601     History Chief Complaint  Patient presents with  . Rectal Bleeding    Nicholas Hines is a 86 y.o. male.  Pt presents to the ED today with rectal bleeding.  He's had problems with hemorrhoids and has been seen at [facility].  He was put on diltiazem hydrocholoride 2% lidocaine cream that was started this past week.  He woke up this am with the sensation that he had to pass gas, but only blood came out.  This happened 2 more times.  No abd pain.  + rectal pain.  Pt has not had a colonoscopy in several years, but did have what sounds like a sigmoidoscopy about a year ago.          Past Medical History:  Diagnosis Date  . Hyperlipidemia     Patient Active Problem List   Diagnosis Date Noted  . Rectal bleeding 08/30/2019  . HLD (hyperlipidemia) 08/30/2019  . HTN (hypertension) 08/30/2019  . BPH (benign prostatic hyperplasia) 08/30/2019  . Gout 08/30/2019  . Allergic rhinitis 08/30/2019  . GERD (gastroesophageal reflux disease) 08/30/2019    History reviewed. No pertinent surgical history.     History reviewed. No pertinent family history.  Social History   Tobacco Use  . Smoking status: Never Smoker  . Smokeless tobacco: Never Used  Substance Use Topics  . Alcohol use: Never  . Drug use: Never    Home Medications Prior to Admission medications   Medication Sig Start Date End Date Taking? Authorizing Provider  allopurinol (ZYLOPRIM) 100 MG tablet Take 100 mg by mouth daily.   Yes [provider]  aspirin 81 MG chewable tablet Chew 81 mg by mouth daily.    Yes [provider]  cholecalciferol (VITAMIN D3) 25 MCG (1000 UNIT) tablet Take 1,000 Units by mouth daily.   Yes [provider]  doxazosin (CARDURA) 8 MG tablet Take 8 mg by mouth daily.   Yes [provider]  esomeprazole (NEXIUM) 40 MG capsule Take 40 mg by mouth daily  at 12 noon.   Yes [provider]  finasteride (PROSCAR) 5 MG tablet Take 5 mg by mouth daily.   Yes [provider]  Lactobacillus (ACIDOPHILUS PROBIOTIC PO) Take 1 tablet by mouth daily.   Yes [provider]  levocetirizine (XYZAL) 5 MG tablet Take 5 mg by mouth every evening.   Yes [provider]  Multiple Vitamins-Minerals (CENTRUM SILVER 50+MEN PO) Take 1 tablet by mouth daily.    Yes [provider]  NONFORMULARY OR COMPOUNDED ITEM See admin instructions. Diltiazem HCL 2%/Lido HCL 2% ointment - apply to affected area three times daily   Yes [provider]  polyethylene glycol (MIRALAX / GLYCOLAX) 17 g packet Take 17 g by mouth daily as needed for mild constipation or moderate constipation.    Yes [provider]  potassium citrate (UROCIT-K) 10 MEQ (1080 MG) SR tablet Take 20 mEq by mouth in the morning and at bedtime.    Yes [provider]  Simethicone (GAS-X PO) Take 1 tablet by mouth 3 (three) times daily as needed (for bloating).   Yes [provider]  simvastatin (ZOCOR) 40 MG tablet Take 40 mg by mouth daily.   Yes [provider]  triamterene-hydrochlorothiazide (MAXZIDE) 75-50 MG tablet Take 1 tablet by mouth daily.   Yes [provider]  Wheat Dextrin (BENEFIBER  PO) Take by mouth See admin instructions. Mix 2 teaspoonfuls with coffee and drink every morning   Yes [provider]  budesonide (ENTOCORT EC) 3 MG 24 hr capsule Take by mouth daily. Patient not taking: Reported on 08/30/2019    [provider]    Allergies    Sulfa antibiotics  Review of Systems   Review of Systems  Gastrointestinal: Positive for blood in stool and rectal pain.  All other systems reviewed and are negative.   Physical Exam Updated Vital Signs BP 117/74   Pulse 83   Temp 98.1 F (36.7 C) (Oral)   Resp 19   Ht 6' (1.829 m)   Wt 90.7 kg   SpO2 97%   BMI 27.12 kg/m    Physical Exam Vitals and nursing note reviewed.  Constitutional:      Appearance: Normal appearance.  HENT:     Head: Normocephalic and atraumatic.     Right Ear: External ear normal.     Left Ear: External ear normal.     Nose: Nose normal.     Mouth/Throat:     Mouth: Mucous membranes are moist.     Pharynx: Oropharynx is clear.  Eyes:     Extraocular Movements: Extraocular movements intact.     Conjunctiva/sclera: Conjunctivae normal.     Pupils: Pupils are equal, round, and reactive to light.  Cardiovascular:     Rate and Rhythm: Normal rate and regular rhythm.     Pulses: Normal pulses.     Heart sounds: Normal heart sounds.  Pulmonary:     Effort: Pulmonary effort is normal.     Breath sounds: Normal breath sounds.  Abdominal:     General: Abdomen is flat. Bowel sounds are normal.     Palpations: Abdomen is soft.  Genitourinary:    Rectum: Guaiac result positive.     Comments: Bright red blood Musculoskeletal:        General: Normal range of motion.     Cervical back: Normal range of motion and neck supple.  Skin:    General: Skin is warm.     Capillary Refill: Capillary refill takes less than 2 seconds.  Neurological:     General: No focal deficit present.     Mental Status: He is alert and oriented to person, place, and time.  Psychiatric:        Mood and Affect: Mood normal.        Behavior: Behavior normal.     ED Results / Procedures / Treatments   Labs (all labs ordered are listed, but only abnormal results are displayed) Labs Reviewed  COMPREHENSIVE METABOLIC PANEL - Abnormal; Notable for the following components:      Result Value   Potassium 3.2 (*)    Glucose, Bld 121 (*)    Total Protein 6.3 (*)    All other components within normal limits  SARS CORONAVIRUS 2 BY RT PCR (HOSPITAL ORDER, [facility])  CBC WITH DIFFERENTIAL/PLATELET  PROTIME-INR  APTT  POC OCCULT BLOOD, ED  SAMPLE TO BLOOD BANK    EKG EKG  Interpretation  Date/Time:  Saturday August 30 2019 06:16:33 EDT Ventricular Rate:  91 PR Interval:    QRS Duration: 158 QT Interval:  405 QTC Calculation: 499 R Axis:   -42 Text Interpretation: Sinus rhythm RBBB  LAFB Since last tracing 24 Jun 2002 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  Rectal pain. EXAM: CT ABDOMEN AND PELVIS WITH CONTRAST TECHNIQUE: Multidetector CT imaging of the abdomen and pelvis was performed using the standard protocol following bolus administration of intravenous contrast. CONTRAST:  130mL OMNIPAQUE IOHEXOL 300 MG/ML  SOLN COMPARISON:  01/30/2018 FINDINGS: Lower chest: Limited visualization of the lower thorax demonstrates minimal dependent subpleural ground-glass atelectasis, left greater than right. No discrete focal airspace opacities. No pleural effusion. Normal heart size. Coronary artery calcifications. No pericardial effusion. Hepatobiliary: Normal hepatic contour. Interval development of an approximately 1.1 cm hypoattenuating lesion within the dome of the right lobe of the liver, incompletely characterized on present examination. Additional scattered subcentimeter hypoattenuating [place] lesions are too small to accurately characterize though similar to the 2019 examination favored to represent hepatic cysts. Normal appearance of the gallbladder given degree distention. No radiopaque gallstones. No intra or extrahepatic biliary ductal dilatation. No ascites. Pancreas: Normal appearance of the pancreas. Spleen: Normal appearance of the spleen. Adrenals/Urinary Tract: There is symmetric enhancement and excretion of the bilateral kidneys. Redemonstrated left-sided renal cysts with dominant partially exophytic cyst arising from the inferolateral aspect the left kidney measuring 5.3 cm in diameter.  No discrete right-sided renal lesions. Note is made of 4 punctate (sub 4 mm) left-sided renal stones (coronal images 68, 70, 77 and 79, series 5 as well as a solitary 3 mm nonobstructing right-sided renal stone (coronal image 59, series 5). Minimal grossly symmetric bilateral perinephric stranding. No evidence of urinary obstruction. Normal appearance the bilateral adrenal glands. There is mass effect of the prostate gland on the undersurface of the urinary bladder. Otherwise, normal appearance of the urinary bladder given degree of distention. Stomach/Bowel: Linear high density debris seen within the rectum (coronal image 95, series 5; axial images 80 through 85, series 2, potentially ingested radiopaque debris though an area of intraluminal contrast extravasation could have a similar appearance. Note, pre contrast and delayed contrast images were not obtained. Scattered colonic diverticulosis without evidence of superimposed acute diverticulitis. Moderate colonic stool burden without evidence of enteric obstruction. Normal appearance of the terminal ileum and the retrocecal appendix. No discrete areas of bowel wall thickening. No pneumoperitoneum, pneumatosis or portal venous gas. Vascular/Lymphatic: Minimal amount of mixed calcified and noncalcified atherosclerotic plaque throughout the normal caliber abdominal aorta, not resulting in a hemodynamically significant stenosis on this non CTA examination. 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 No bulky retroperitoneal, mesenteric, pelvic or inguinal lymphadenopathy. Reproductive: Prostatomegaly, in particular, hypertrophy with mass effect upon the undersurface of the urinary bladder. Other: Small mesenteric fat containing periumbilical hernia. There is a minimal amount of subcutaneous edema about the midline of the low back. Musculoskeletal: Stigmata of dish within the thoracic spine. Mild to moderate multilevel lumbar spine DDD, worse at L5-S1 with  disc space height loss, endplate irregularity and sclerosis. IMPRESSION: 1. Linear radiopaque debris within the rectum, potentially ingested debris, though intraluminal contrast extravasation could have a similar appearance, incompletely evaluated on this non multiphase examination. Clinical correlation is advised. Further evaluation with multiphase GI bleeding protocol CT scan of the abdomen pelvis could be performed as indicated. 2. Colonic diverticulosis without evidence superimposed acute diverticulitis. 3. Small hiatal hernia. 4. Nonobstructing bilateral nephrolithiasis, left greater than right. 5. Coronary calcifications.  Aortic Atherosclerosis (ICD10-I70.0). 6. Interval development of an approximately 1.1 cm hypoattenuating lesion within the dome of the right lobe of the liver, not seen on the 2019  examination though incompletely characterized on the present examination. Further evaluation with nonemergent abdominal MRI could be performed as indicated. Critical Value/emergent results were called by telephone at the time of interpretation on 08/30/2019 at 9:07 am to provider [facility] , who verbally acknowledged these results. Electronically Signed   By: Sandi Mariscal M.D.   On: 08/30/2019 09:12    Procedures Procedures (including critical care time)  Medications Ordered in ED Medications  sodium chloride 0.9 % bolus 500 mL (500 mLs Intravenous New Bag/Given 08/30/19 0730)  iohexol (OMNIPAQUE) 300 MG/ML solution 100 mL (100 mLs Intravenous Contrast Given 08/30/19 0810)    ED Course  I have reviewed the triage vital signs and the nursing notes.  Pertinent labs & imaging results that were available during my care of the patient were reviewed by me and considered in my medical decision making (see chart for details).    MDM Rules/Calculators/A&P                          Pt d/w the radiologist who recommends a multiphase CT scan in several hours prior to IR intervention since he just had an  IV contrast bolus.  I spoke with Dr. Alessandra Bevels [facility]) who recommends an admission to [facility] for potential IR intervention.  Pt d/w Dr. Dyann Kief (triad) who will admit to [facility].  Pt remains stable with his vitals.  H/h ok.  CRITICAL CARE Performed by: Isla Pence   Total critical care time: 30 minutes  Critical care time was exclusive of separately billable procedures and treating other patients.  Critical care was necessary to treat or prevent imminent or life-threatening deterioration.  Critical care was time spent personally by me on the following activities: development of treatment plan with patient and/or surrogate as well as nursing, discussions with consultants, evaluation of patient's response to treatment, examination of patient, obtaining history from patient or surrogate, ordering and performing treatments and interventions, ordering and review of laboratory studies, ordering and review of radiographic studies, pulse oximetry and re-evaluation of patient's condition.  Final Clinical Impression(s) / ED Diagnoses Final diagnoses:  Rectal bleeding    Rx / DC Orders ED Discharge Orders    None       Isla Pence, MD 08/30/19 1014

## 2019-08-30 NOTE — ED Triage Notes (Signed)
Pt reports he has passed bright red blood 3 times since last night.  Pt reports minimal discomfort.

## 2019-08-30 NOTE — Plan of Care (Signed)
  Problem: Education: Goal: Ability to identify signs and symptoms of gastrointestinal bleeding will improve Outcome: Progressing   Problem: Fluid Volume: Goal: Will show no signs and symptoms of excessive bleeding Outcome: Progressing   

## 2019-08-30 NOTE — ED Notes (Signed)
Pt had large bloody bm, pt ambulatory to the bathroom, pt denies dizziness.  Cleaned pt, changed bedding.

## 2019-08-30 NOTE — ED Provider Notes (Addendum)
MSE was initiated and I personally evaluated the patient and placed orders (if any) at  6:31 AM on August 30, 2019.  The patient appears stable so that the remainder of the MSE may be completed by another provider.  Patient states he started having some dried blood on his stool less than a month ago and rectal pain.  He was seen at [facility] and prescribed diltiazem hydrochloride 2% lidocaine cream that he was applying to his rectum.  He states he read that the side effect could be more rectal bleeding.  He states this morning at 4:30 AM he has had about 3 episodes of rectal bleeding.  He denies any abdominal pain.  He only complains of rectal pain.  Patient is awake and alert and cooperative.  He is getting a rectal exam by nursing staff, he has gross blood on the rectal exam glove.  His abdomen is soft and nontender, he has no abdominal pain.   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

## 2019-08-30 NOTE — H&P (Signed)
History and Physical    Nicholas Hines OIN:[id number] DOB: 05-31-1933 DOA: 08/30/2019  PCP: Patient, No Pcp Per   Patient coming from: home   I have personally briefly reviewed patient's old medical records in [place]  Chief Complaint: rectal pain and bright red blood per rectum.  HPI: Nicholas Hines is a 86 y.o. male with medical history significant of BPH, hyperlipidemia, hypertension, gout and internal hemorrhoids; who presented to the emergency department secondary to rectal pain and bright red blood per rectum.  Patient reports having approximately a month of rectal bleeding for what he had follow-up with his gastroenterologist in [facility]).  He expressed that despite multiple treatments he continued to experience sudden intermittent bright red blood per rectum.  Around 2 AM in the morning today prior to come to the emergency department he developed the largest amount of bright red blood that he had ever seen throughout the length of his condition; he also reports associated rectal pain.  Patient reports no fever, no chills, no nausea, no vomiting, no abdominal pain, no focal weakness, no headaches, no dysuria or hematuria, no other complaints.  ED Course: Patient case was discussed with radiologist who recommends a multiphase CT scan in several hours prior to interventional radiology's intervention as recommended by GI service.  ED physician I spoke with gastroenterologist from [place] on call who recommended patient to be admitted and [facility] for further evaluation, management and potential IR intervention if needed.  Patient was hemodynamically stable; gentle fluid resuscitation started and was, type and screened.  Review of Systems: As per HPI otherwise all other systems reviewed and are negative.   Past Medical History:  Diagnosis Date  . Hyperlipidemia     Family history: Patient reported history of hypertension and cholesterol.  Social History   reports that he has never smoked. He has never used smokeless tobacco. He reports that he does not drink alcohol and does not use drugs.  Allergies  Allergen Reactions  . Sulfa Antibiotics Rash    Prior to Admission medications   Medication Sig Start Date End Date Taking? Authorizing Provider  allopurinol (ZYLOPRIM) 100 MG tablet Take 100 mg by mouth daily.   Yes [provider]  aspirin 81 MG chewable tablet Chew 81 mg by mouth daily.    Yes [provider]  cholecalciferol (VITAMIN D3) 25 MCG (1000 UNIT) tablet Take 1,000 Units by mouth daily.   Yes [provider]  doxazosin (CARDURA) 8 MG tablet Take 8 mg by mouth daily.   Yes [provider]  esomeprazole (NEXIUM) 40 MG capsule Take 40 mg by mouth daily at 12 noon.   Yes [provider]  finasteride (PROSCAR) 5 MG tablet Take 5 mg by mouth daily.   Yes [provider]  Lactobacillus (ACIDOPHILUS PROBIOTIC PO) Take 1 tablet by mouth daily.   Yes [provider]  levocetirizine (XYZAL) 5 MG tablet Take 5 mg by mouth every evening.   Yes [provider]  Multiple Vitamins-Minerals (CENTRUM SILVER 50+MEN PO) Take 1 tablet by mouth daily.    Yes [provider]  NONFORMULARY OR COMPOUNDED ITEM See admin instructions. Diltiazem HCL 2%/Lido HCL 2% ointment - apply to affected area three times daily   Yes [provider]  polyethylene glycol (MIRALAX / GLYCOLAX) 17 g packet Take 17 g by mouth daily as needed for mild constipation or moderate constipation.    Yes [provider]  potassium citrate (UROCIT-K)  10 MEQ (1080 MG) SR tablet Take 20 mEq by mouth in the morning and at bedtime.    Yes [provider]  Simethicone (GAS-X PO) Take 1 tablet by mouth 3 (three) times daily as needed (for bloating).   Yes [provider]  simvastatin (ZOCOR) 40 MG tablet Take 40 mg by mouth daily.   Yes [provider]    triamterene-hydrochlorothiazide (MAXZIDE) 75-50 MG tablet Take 1 tablet by mouth daily.   Yes [provider]  Wheat Dextrin (BENEFIBER PO) Take by mouth See admin instructions. Mix 2 teaspoonfuls with coffee and drink every morning   Yes [provider]  budesonide (ENTOCORT EC) 3 MG 24 hr capsule Take by mouth daily. Patient not taking: Reported on 08/30/2019    [provider]    Physical Exam: Vitals:   08/30/19 0612 08/30/19 0618 08/30/19 0630  BP:  (!) 141/78 117/74  Pulse:  92 83  Resp:  18 19  Temp:  98.1 F (36.7 C)   TempSrc:  Oral   SpO2:  97% 97%  Weight: 90.7 kg    Height: 6' (1.829 m)      Constitutional: NAD, calm and denying any chest pain, shortness of breath or palpitation.  Reports rectal pain and had couple episodes of bright red blood per rectum while waiting in the ED. Vitals:   08/30/19 0612 08/30/19 0618 08/30/19 0630  BP:  (!) 141/78 117/74  Pulse:  92 83  Resp:  18 19  Temp:  98.1 F (36.7 C)   TempSrc:  Oral   SpO2:  97% 97%  Weight: 90.7 kg    Height: 6' (1.829 m)     Eyes: PERRL, lids and conjunctivae normal, no icterus, no nystagmus. ENMT: Mucous membranes are moist. Posterior pharynx clear of any exudate or lesions. Neck: normal, supple, no masses, no thyromegaly Respiratory: clear to auscultation bilaterally, no wheezing, no crackles. Normal respiratory effort. No accessory muscle use.  Cardiovascular: Regular rate and rhythm, no murmurs / rubs / gallops. No extremity edema. 2+ pedal pulses. No carotid bruits.  Abdomen: no tenderness, no masses palpated. No hepatosplenomegaly. Bowel sounds positive.  Musculoskeletal: no clubbing / cyanosis. No joint deformity upper and lower extremities. Good ROM, no contractures. Normal muscle tone.  Skin: no rashes, lesions, ulcers. No induration Neurologic: CN 2-12 grossly intact. Sensation intact, DTR normal. Strength 5/5 in all 4.  Psychiatric: Normal judgment and insight.  Alert and oriented x 3. Normal mood.   Labs on Admission: I have personally reviewed following labs and imaging studies  CBC: Recent Labs  Lab 08/30/19 0617  WBC 5.2  NEUTROABS 3.7  HGB 15.1  HCT 45.3  MCV 96.2  PLT 810    Basic Metabolic Panel: Recent Labs  Lab 08/30/19 0617  NA 138  K 3.2*  CL 99  CO2 27  GLUCOSE 121*  BUN 15  CREATININE 0.92  CALCIUM 8.9    GFR: Estimated Creatinine Clearance: 66.8 mL/min (by C-G formula based on SCr of 0.92 mg/dL).  Liver Function Tests: Recent Labs  Lab 08/30/19 0617  AST 21  ALT 21  ALKPHOS 53  BILITOT 0.8  PROT 6.3*  ALBUMIN 4.0   Radiological Exams on Admission: CT ABDOMEN PELVIS W CONTRAST  Result Date: 08/30/2019 CLINICAL DATA:  Melena.  Rectal pain. EXAM: CT ABDOMEN AND PELVIS WITH CONTRAST TECHNIQUE: Multidetector CT imaging of the abdomen and pelvis was performed using the standard protocol following bolus administration of intravenous contrast. CONTRAST:  130mL  OMNIPAQUE IOHEXOL 300 MG/ML  SOLN COMPARISON:  01/30/2018 FINDINGS: Lower chest: Limited visualization of the lower thorax demonstrates minimal dependent subpleural ground-glass atelectasis, left greater than right. No discrete focal airspace opacities. No pleural effusion. Normal heart size. Coronary artery calcifications. No pericardial effusion. Hepatobiliary: Normal hepatic contour. Interval development of an approximately 1.1 cm hypoattenuating lesion within the dome of the right lobe of the liver, incompletely characterized on present examination. Additional scattered subcentimeter hypoattenuating [place] lesions are too small to accurately characterize though similar to the 2019 examination favored to represent hepatic cysts. Normal appearance of the gallbladder given degree distention. No radiopaque gallstones. No intra or extrahepatic biliary ductal dilatation. No ascites. Pancreas: Normal appearance of the pancreas. Spleen: Normal appearance of the spleen.  Adrenals/Urinary Tract: There is symmetric enhancement and excretion of the bilateral kidneys. Redemonstrated left-sided renal cysts with dominant partially exophytic cyst arising from the inferolateral aspect the left kidney measuring 5.3 cm in diameter. No discrete right-sided renal lesions. Note is made of 4 punctate (sub 4 mm) left-sided renal stones (coronal images 68, 70, 77 and 79, series 5 as well as a solitary 3 mm nonobstructing right-sided renal stone (coronal image 59, series 5). Minimal grossly symmetric bilateral perinephric stranding. No evidence of urinary obstruction. Normal appearance the bilateral adrenal glands. There is mass effect of the prostate gland on the undersurface of the urinary bladder. Otherwise, normal appearance of the urinary bladder given degree of distention. Stomach/Bowel: Linear high density debris seen within the rectum (coronal image 95, series 5; axial images 80 through 85, series 2, potentially ingested radiopaque debris though an area of intraluminal contrast extravasation could have a similar appearance. Note, pre contrast and delayed contrast images were not obtained. Scattered colonic diverticulosis without evidence of superimposed acute diverticulitis. Moderate colonic stool burden without evidence of enteric obstruction. Normal appearance of the terminal ileum and the retrocecal appendix. No discrete areas of bowel wall thickening. No pneumoperitoneum, pneumatosis or portal venous gas. Vascular/Lymphatic: Minimal amount of mixed calcified and noncalcified atherosclerotic plaque throughout the normal caliber abdominal aorta, not resulting in a hemodynamically significant stenosis on this non CTA examination. 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 No bulky retroperitoneal, mesenteric, pelvic or inguinal lymphadenopathy. Reproductive: Prostatomegaly, in particular, hypertrophy with mass effect upon the undersurface of the urinary bladder. Other: Small  mesenteric fat containing periumbilical hernia. There is a minimal amount of subcutaneous edema about the midline of the low back. Musculoskeletal: Stigmata of dish within the thoracic spine. Mild to moderate multilevel lumbar spine DDD, worse at L5-S1 with disc space height loss, endplate irregularity and sclerosis. IMPRESSION: 1. Linear radiopaque debris within the rectum, potentially ingested debris, though intraluminal contrast extravasation could have a similar appearance, incompletely evaluated on this non multiphase examination. Clinical correlation is advised. Further evaluation with multiphase GI bleeding protocol CT scan of the abdomen pelvis could be performed as indicated. 2. Colonic diverticulosis without evidence superimposed acute diverticulitis. 3. Small hiatal hernia. 4. Nonobstructing bilateral nephrolithiasis, left greater than right. 5. Coronary calcifications.  Aortic Atherosclerosis (ICD10-I70.0). 6. Interval development of an approximately 1.1 cm hypoattenuating lesion within the dome of the right lobe of the liver, not seen on the 2019 examination though incompletely characterized on the present examination. Further evaluation with nonemergent abdominal MRI could be performed as indicated. Critical Value/emergent results were called by telephone at the time of interpretation on 08/30/2019 at 9:07 am to provider [facility] , who verbally acknowledged these results. Electronically Signed   By: Jenny Reichmann  Watts M.D.   On: 08/30/2019 09:12    EKG: Independently reviewed.  Sinus rhythm, no ischemic changes.  Normal QT.  Assessment/Plan 1-Rectal bleeding and pain: -With concern for internal hemorrhoids -Hemodynamically stable -Avoid heparin products and aspirin -Type and screen -IV fluids and supportive care -Clear liquid diet -Transferred to [facility] for GI evaluation and if needed IR intervention.  2-HLD (hyperlipidemia) -Continue Zocor  3-HTN (hypertension) -Stable  and well-controlled -Continue the use of max oxide -Close monitoring of blood pressure with adjustment to antihypertensive regimen as needed.  4-history of BPH (benign prostatic hyperplasia) -No symptoms of urinary retention: -Continue the use of Proscar and Cardura  5-Gout -No acute flare -Continue allopurinol.  6-Allergic rhinitis -Continue loratadine daily.  7-GERD (gastroesophageal reflux disease) -Continue PPI.  8-hypokalemia -will check Mg level -replete potassium and follow trend    DVT prophylaxis: SCD's Code Status:   Full code Family Communication:  No family member at bedside. Disposition Plan:   Patient is from:  Home  Anticipated DC to:  Home  Anticipated DC date:  08/31/19  Anticipated DC barriers: Further bleeding  Consults called:  EDP has consulted [facility] service Admission status:  Observation, MedSurg, length of stay less than 2 midnights.  [facility].  Severity of Illness: Mild severity currently; high risk of decompensation requiring hospital observation for further prevention and intervention if needed.  Follow serial hemoglobin trend for instability and electrolyte fusion if required.    Barton Dubois MD [facility]  How to contact the [facility] Attending or Consulting provider [place] or covering provider during after hours [place], for this patient?   1. Check the care team in [facility] and look for a) attending/consulting [facility] provider listed and b) the [facility] team listed 2. Log into [email or web address] and use [place]'s universal password to access. If you do not have the password, please contact the hospital operator. 3. Locate the [facility] provider you are looking for under [facility] and page to a number that you can be directly reached. 4. If you still have difficulty reaching the provider, please page the [facility] (Director on Call) for the Hospitalists listed on amion for assistance.  08/30/2019, 10:33 AM

## 2019-08-30 NOTE — ED Notes (Signed)
Care link at bedside, report to care link, pt expressed thanks for care received.  Pt from dpt via care link.

## 2019-08-31 ENCOUNTER — Encounter (HOSPITAL_COMMUNITY)
Disposition: A | Discharge status: Home Health | Payer: Self-pay | Source: Home / Self Care | Attending: Internal Medicine

## 2019-08-31 ENCOUNTER — Other Ambulatory Visit: Payer: Self-pay

## 2019-08-31 ENCOUNTER — Encounter (HOSPITAL_COMMUNITY): Payer: Self-pay | Admitting: Internal Medicine

## 2019-08-31 DIAGNOSIS — K635 Polyp of colon: Secondary | ICD-10-CM | POA: Diagnosis present

## 2019-08-31 DIAGNOSIS — K7689 Other specified diseases of liver: Secondary | ICD-10-CM | POA: Diagnosis not present

## 2019-08-31 DIAGNOSIS — Z79899 Other long term (current) drug therapy: Secondary | ICD-10-CM | POA: Diagnosis not present

## 2019-08-31 DIAGNOSIS — K2100 Gastro-esophageal reflux disease with esophagitis, without bleeding: Secondary | ICD-10-CM

## 2019-08-31 DIAGNOSIS — E876 Hypokalemia: Secondary | ICD-10-CM | POA: Diagnosis present

## 2019-08-31 DIAGNOSIS — K219 Gastro-esophageal reflux disease without esophagitis: Secondary | ICD-10-CM | POA: Diagnosis present

## 2019-08-31 DIAGNOSIS — K6289 Other specified diseases of anus and rectum: Secondary | ICD-10-CM

## 2019-08-31 DIAGNOSIS — C20 Malignant neoplasm of rectum: Secondary | ICD-10-CM | POA: Diagnosis present

## 2019-08-31 DIAGNOSIS — Z7982 Long term (current) use of aspirin: Secondary | ICD-10-CM | POA: Diagnosis not present

## 2019-08-31 DIAGNOSIS — N400 Enlarged prostate without lower urinary tract symptoms: Secondary | ICD-10-CM | POA: Diagnosis present

## 2019-08-31 DIAGNOSIS — C787 Secondary malignant neoplasm of liver and intrahepatic bile duct: Secondary | ICD-10-CM | POA: Diagnosis present

## 2019-08-31 DIAGNOSIS — K625 Hemorrhage of anus and rectum: Secondary | ICD-10-CM | POA: Diagnosis not present

## 2019-08-31 DIAGNOSIS — I10 Essential (primary) hypertension: Secondary | ICD-10-CM

## 2019-08-31 DIAGNOSIS — C7B8 Other secondary neuroendocrine tumors: Secondary | ICD-10-CM | POA: Diagnosis not present

## 2019-08-31 DIAGNOSIS — E785 Hyperlipidemia, unspecified: Secondary | ICD-10-CM | POA: Diagnosis present

## 2019-08-31 DIAGNOSIS — C7A026 Malignant carcinoid tumor of the rectum: Secondary | ICD-10-CM | POA: Diagnosis not present

## 2019-08-31 DIAGNOSIS — J309 Allergic rhinitis, unspecified: Secondary | ICD-10-CM | POA: Diagnosis present

## 2019-08-31 DIAGNOSIS — K922 Gastrointestinal hemorrhage, unspecified: Secondary | ICD-10-CM

## 2019-08-31 DIAGNOSIS — E7800 Pure hypercholesterolemia, unspecified: Secondary | ICD-10-CM

## 2019-08-31 DIAGNOSIS — M109 Gout, unspecified: Secondary | ICD-10-CM | POA: Diagnosis present

## 2019-08-31 DIAGNOSIS — D62 Acute posthemorrhagic anemia: Secondary | ICD-10-CM | POA: Diagnosis present

## 2019-08-31 DIAGNOSIS — Z20822 Contact with and (suspected) exposure to covid-19: Secondary | ICD-10-CM | POA: Diagnosis present

## 2019-08-31 DIAGNOSIS — C801 Malignant (primary) neoplasm, unspecified: Secondary | ICD-10-CM | POA: Diagnosis not present

## 2019-08-31 HISTORY — PX: BIOPSY: SHX5522

## 2019-08-31 HISTORY — PX: FLEXIBLE SIGMOIDOSCOPY: SHX5431

## 2019-08-31 LAB — CBC
HCT: 33.2 % — ABNORMAL LOW (ref 39.0–52.0)
Hemoglobin: 10.9 g/dL — ABNORMAL LOW (ref 13.0–17.0)
MCH: 31.6 pg (ref 26.0–34.0)
MCHC: 32.8 g/dL (ref 30.0–36.0)
MCV: 96.2 fL (ref 80.0–100.0)
Platelets: 144 10*3/uL — ABNORMAL LOW (ref 150–400)
RBC: 3.45 MIL/uL — ABNORMAL LOW (ref 4.22–5.81)
RDW: 13.2 % (ref 11.5–15.5)
WBC: 5.9 10*3/uL (ref 4.0–10.5)
nRBC: 0 % (ref 0.0–0.2)

## 2019-08-31 LAB — HEMOGLOBIN AND HEMATOCRIT, BLOOD
HCT: 34.1 % — ABNORMAL LOW (ref 39.0–52.0)
HCT: 33.4 % — ABNORMAL LOW (ref 39.0–52.0)
HCT: 30.4 % — ABNORMAL LOW (ref 39.0–52.0)
Hemoglobin: 11.3 g/dL — ABNORMAL LOW (ref 13.0–17.0)
Hemoglobin: 10.9 g/dL — ABNORMAL LOW (ref 13.0–17.0)
Hemoglobin: 10 g/dL — ABNORMAL LOW (ref 13.0–17.0)

## 2019-08-31 SURGERY — SIGMOIDOSCOPY, FLEXIBLE

## 2019-08-31 MED ORDER — SODIUM CHLORIDE 0.9 % IV SOLN: INTRAVENOUS | Status: DC

## 2019-08-31 MED ORDER — POTASSIUM CHLORIDE CRYS ER 20 MEQ PO TBCR
40.0000 meq | EXTENDED_RELEASE_TABLET | Freq: Once | ORAL | Status: AC
  Administered 2019-08-31: 40 meq via ORAL
  Filled 2019-08-31: qty 2

## 2019-08-31 MED ORDER — SODIUM CHLORIDE 0.9 % IV BOLUS
250.0000 mL | Freq: Once | INTRAVENOUS | Status: AC
  Administered 2019-08-31: 250 mL via INTRAVENOUS

## 2019-08-31 MED ORDER — LIDOCAINE 5 % EX OINT
TOPICAL_OINTMENT | Freq: Two times a day (BID) | CUTANEOUS | Status: DC
  Filled 2019-08-31: qty 35.44

## 2019-08-31 MED ORDER — FLEET ENEMA 7-19 GM/118ML RE ENEM
1.0000 | ENEMA | RECTAL | Status: AC
  Administered 2019-08-31: 1 via RECTAL
  Filled 2019-08-31: qty 1

## 2019-08-31 NOTE — Consult Note (Signed)
Referring Provider:  [facility] Primary Care Physician:  Patient, No Pcp Per Primary Gastroenterologist:  [facility]  Reason for Consultation: Rectal bleeding  HPI: Nicholas Hines is a 86 y.o. 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  He was transferred to [facility] for further management because of lack of GI coverage at other hospital.  Patient has been having intermittent rectal bleeding since earlier this month.  Was seen by PA at [facility] and was started on topical treatment as well as suppositories without any improvement in bleeding.  He started noticing worsening bleeding 2 days ago and hence he presented to [facility] for further evaluation.  Was also complaining of some rectal discomfort and generalized abdominal discomfort.  CT abdomen pelvis with contrast yesterday showed radiopaque debris is within the rectum could be ingested material or could be intraluminal contrast extravasation.  Patient's hemoglobin has dropped from 15 on admission to 10.9 today.  Had multiple bowel movements overnight.  Denies any nausea or vomiting.  Last colonoscopy was 4 years ago.  Had flexible sigmoidoscopy in 2019 for evaluation of diarrhea which showed microscopic colitis.  He was treated with budesonide with resolution of symptoms.  Past Medical History:  Diagnosis Date  . Hyperlipidemia     History reviewed. No pertinent surgical history.  Prior to Admission medications   Medication Sig Start Date End Date Taking? Authorizing Provider  allopurinol (ZYLOPRIM) 100 MG tablet Take 100 mg by mouth daily.   Yes [provider]  aspirin 81 MG chewable tablet Chew 81 mg by mouth daily.    Yes [provider]  cholecalciferol (VITAMIN D3) 25 MCG (1000 UNIT) tablet Take 1,000 Units by mouth daily.   Yes [provider]  doxazosin  (CARDURA) 8 MG tablet Take 8 mg by mouth daily.   Yes [provider]  esomeprazole (NEXIUM) 40 MG capsule Take 40 mg by mouth daily at 12 noon.   Yes [provider]  finasteride (PROSCAR) 5 MG tablet Take 5 mg by mouth daily.   Yes [provider]  Lactobacillus (ACIDOPHILUS PROBIOTIC PO) Take 1 tablet by mouth daily.   Yes [provider]  levocetirizine (XYZAL) 5 MG tablet Take 5 mg by mouth every evening.   Yes [provider]  Multiple Vitamins-Minerals (CENTRUM SILVER 50+MEN PO) Take 1 tablet by mouth daily.    Yes [provider]  NONFORMULARY OR COMPOUNDED ITEM See admin instructions. Diltiazem HCL 2%/Lido HCL 2% ointment - apply to affected area three times daily   Yes [provider]  polyethylene glycol (MIRALAX / GLYCOLAX) 17 g packet Take 17 g by mouth daily as needed for mild constipation or moderate constipation.    Yes [provider]  potassium citrate (UROCIT-K) 10 MEQ (1080 MG) SR tablet Take 20 mEq by mouth in the morning and at bedtime.    Yes [provider]  Simethicone (GAS-X PO) Take 1 tablet by mouth 3 (three) times daily as needed (for bloating).   Yes [provider]  simvastatin (ZOCOR) 40 MG tablet Take 40 mg by mouth daily.   Yes [provider]  triamterene-hydrochlorothiazide (MAXZIDE) 75-50 MG tablet Take 1 tablet by mouth daily.   Yes [provider]  Wheat Dextrin (BENEFIBER PO) Take by mouth See admin instructions. Mix 2 teaspoonfuls with coffee and drink every morning   Yes  [provider]  budesonide (ENTOCORT EC) 3 MG 24 hr capsule Take by mouth daily. Patient not taking: Reported on 08/30/2019    [provider]    Scheduled Meds: . allopurinol  100 mg Oral Daily  . budesonide  3 mg Oral Daily  . doxazosin  8 mg Oral QHS  . finasteride  5 mg Oral Daily  . loratadine  10 mg Oral Daily  . pantoprazole  40 mg Oral Daily  .  simvastatin  40 mg Oral q1800  . sodium phosphate  1 enema Rectal NOW  . triamterene-hydrochlorothiazide  1 tablet Oral Daily   Continuous Infusions: . sodium chloride 100 mL/hr at 08/31/19 0030   PRN Meds:.acetaminophen **OR** acetaminophen, ondansetron **OR** ondansetron (ZOFRAN) IV, polyethylene glycol  Allergies as of 08/30/2019 - Review Complete 08/30/2019  Allergen Reaction Noted  . Sulfa antibiotics Rash 08/30/2019    History reviewed. No pertinent family history.  Social History   Socioeconomic History  . Marital status: Single    Spouse name: Not on file  . Number of children: Not on file  . Years of education: Not on file  . Highest education level: Not on file  Occupational History  . Not on file  Tobacco Use  . Smoking status: Never Smoker  . Smokeless tobacco: Never Used  Substance and Sexual Activity  . Alcohol use: Never  . Drug use: Never  . Sexual activity: Not on file  Other Topics Concern  . Not on file  Social History Narrative  . Not on file   Social Determinants of Health   Financial Resource Strain:   . Difficulty of Paying Living Expenses:   Food Insecurity:   . Worried About Charity fundraiser in the Last Year:   . Arboriculturist in the Last Year:   Transportation Needs:   . Film/video editor (Medical):   Marland Kitchen Lack of Transportation (Non-Medical):   Physical Activity:   . Days of Exercise per Week:   . Minutes of Exercise per Session:   Stress:   . Feeling of Stress :   Social Connections:   . Frequency of Communication with Friends and Family:   . Frequency of Social Gatherings with Friends and Family:   . Attends Religious Services:   . Active Member of Clubs or Organizations:   . Attends Archivist Meetings:   Marland Kitchen Marital Status:   Intimate Partner Violence:   . Fear of Current or Ex-Partner:   . Emotionally Abused:   Marland Kitchen Physically Abused:   . Sexually Abused:     Review of Systems: Review of Systems   Constitutional: Positive for malaise/fatigue. Negative for chills and fever.  HENT: Negative for hearing loss and tinnitus.   Eyes: Negative for blurred vision and double vision.  Respiratory: Negative for cough and hemoptysis.   Cardiovascular: Negative for chest pain and palpitations.  Gastrointestinal: Positive for abdominal pain, blood in stool, constipation and diarrhea. Negative for heartburn, melena, nausea and vomiting.  Genitourinary: Negative for dysuria and urgency.  Musculoskeletal: Negative for myalgias and neck pain.  Skin: Negative for itching and rash.  Neurological: Positive for dizziness. Negative for loss of consciousness.  Endo/Heme/Allergies: Bruises/bleeds easily.  Psychiatric/Behavioral: Negative for depression. The patient is nervous/anxious.     Physical Exam: Vital signs: Vitals:   08/31/19 0630 08/31/19 0632  BP:    Pulse:    Resp:    Temp:    SpO2: 98% 99%   Last BM  Date: 08/30/19 Physical Exam Vitals and nursing note reviewed.  Constitutional:      General: He is not in acute distress.    Appearance: Normal appearance.  HENT:     Head: Normocephalic and atraumatic.     Nose: Nose normal.     Mouth/Throat:     Mouth: Mucous membranes are moist.     Pharynx: Oropharynx is clear. No oropharyngeal exudate.  Eyes:     General: No scleral icterus.    Extraocular Movements: Extraocular movements intact.  Cardiovascular:     Rate and Rhythm: Normal rate and regular rhythm.     Heart sounds: Murmur heard.   Pulmonary:     Effort: Pulmonary effort is normal. No respiratory distress.     Breath sounds: Normal breath sounds.  Abdominal:     General: Bowel sounds are normal. There is no distension.     Palpations: Abdomen is soft.     Tenderness: There is no abdominal tenderness. There is no guarding.  Musculoskeletal:     Cervical back: Normal range of motion.     Right lower leg: No edema.     Left lower leg: No edema.  Skin:    General: Skin  is warm.     Findings: No rash.  Neurological:     Mental Status: He is alert and oriented to person, place, and time.  Psychiatric:        Thought Content: Thought content normal.        Judgment: Judgment normal.     Comments: Somewhat upset     GI:  Lab Results: Recent Labs    08/30/19 0617 08/30/19 0617 08/30/19 1459 08/31/19 0141 08/31/19 0554  WBC 5.2  --  5.4  --   --   HGB 15.1   < > 13.4 11.3* 10.9*  HCT 45.3   < > 40.7 34.1* 33.4*  PLT 162  --  148*  --   --    < > = values in this interval not displayed.   BMET Recent Labs    08/30/19 0617  NA 138  K 3.2*  CL 99  CO2 27  GLUCOSE 121*  BUN 15  CREATININE 0.92  CALCIUM 8.9   LFT Recent Labs    08/30/19 0617  PROT 6.3*  ALBUMIN 4.0  AST 21  ALT 21  ALKPHOS 53  BILITOT 0.8   PT/INR Recent Labs    08/30/19 0617  LABPROT 12.3  INR 1.0     Studies/Results: CT ABDOMEN PELVIS W CONTRAST  Result Date: 08/30/2019 CLINICAL DATA:  Melena.  Rectal pain. EXAM: CT ABDOMEN AND PELVIS WITH CONTRAST TECHNIQUE: Multidetector CT imaging of the abdomen and pelvis was performed using the standard protocol following bolus administration of intravenous contrast. CONTRAST:  117mL OMNIPAQUE IOHEXOL 300 MG/ML  SOLN COMPARISON:  01/30/2018 FINDINGS: Lower chest: Limited visualization of the lower thorax demonstrates minimal dependent subpleural ground-glass atelectasis, left greater than right. No discrete focal airspace opacities. No pleural effusion. Normal heart size. Coronary artery calcifications. No pericardial effusion. Hepatobiliary: Normal hepatic contour. Interval development of an approximately 1.1 cm hypoattenuating lesion within the dome of the right lobe of the liver, incompletely characterized on present examination. Additional scattered subcentimeter hypoattenuating [place] lesions are too small to accurately characterize though similar to the 2019 examination favored to represent hepatic cysts. Normal  appearance of the gallbladder given degree distention. No radiopaque gallstones. No intra or extrahepatic biliary ductal dilatation. No ascites. Pancreas: Normal appearance of the  pancreas. Spleen: Normal appearance of the spleen. Adrenals/Urinary Tract: There is symmetric enhancement and excretion of the bilateral kidneys. Redemonstrated left-sided renal cysts with dominant partially exophytic cyst arising from the inferolateral aspect the left kidney measuring 5.3 cm in diameter. No discrete right-sided renal lesions. Note is made of 4 punctate (sub 4 mm) left-sided renal stones (coronal images 68, 70, 77 and 79, series 5 as well as a solitary 3 mm nonobstructing right-sided renal stone (coronal image 59, series 5). Minimal grossly symmetric bilateral perinephric stranding. No evidence of urinary obstruction. Normal appearance the bilateral adrenal glands. There is mass effect of the prostate gland on the undersurface of the urinary bladder. Otherwise, normal appearance of the urinary bladder given degree of distention. Stomach/Bowel: Linear high density debris seen within the rectum (coronal image 95, series 5; axial images 80 through 85, series 2, potentially ingested radiopaque debris though an area of intraluminal contrast extravasation could have a similar appearance. Note, pre contrast and delayed contrast images were not obtained. Scattered colonic diverticulosis without evidence of superimposed acute diverticulitis. Moderate colonic stool burden without evidence of enteric obstruction. Normal appearance of the terminal ileum and the retrocecal appendix. No discrete areas of bowel wall thickening. No pneumoperitoneum, pneumatosis or portal venous gas. Vascular/Lymphatic: Minimal amount of mixed calcified and noncalcified atherosclerotic plaque throughout the normal caliber abdominal aorta, not resulting in a hemodynamically significant stenosis on this non CTA examination. The bilateral renal arteries are  noted to be duplicated. No bulky retroperitoneal, mesenteric, pelvic or inguinal lymphadenopathy. Reproductive: Prostatomegaly, in particular, hypertrophy with mass effect upon the undersurface of the urinary bladder. Other: Small mesenteric fat containing periumbilical hernia. There is a minimal amount of subcutaneous edema about the midline of the low back. Musculoskeletal: Stigmata of dish within the thoracic spine. Mild to moderate multilevel lumbar spine DDD, worse at L5-S1 with disc space height loss, endplate irregularity and sclerosis. IMPRESSION: 1. Linear radiopaque debris within the rectum, potentially ingested debris, though intraluminal contrast extravasation could have a similar appearance, incompletely evaluated on this non multiphase examination. Clinical correlation is advised. Further evaluation with multiphase GI bleeding protocol CT scan of the abdomen pelvis could be performed as indicated. 2. Colonic diverticulosis without evidence superimposed acute diverticulitis. 3. Small hiatal hernia. 4. Nonobstructing bilateral nephrolithiasis, left greater than right. 5. Coronary calcifications.  Aortic Atherosclerosis (ICD10-I70.0). 6. Interval development of an approximately 1.1 cm hypoattenuating lesion within the dome of the right lobe of the liver, not seen on the 2019 examination though incompletely characterized on the present examination. Further evaluation with nonemergent abdominal MRI could be performed as indicated. Critical Value/emergent results were called by telephone at the time of interpretation on 08/30/2019 at 9:07 am to provider [facility] , who verbally acknowledged these results. Electronically Signed   By: Sandi Mariscal M.D.   On: 08/30/2019 09:12    Impression/Plan: -Rectal bleeding.  Intermittent symptoms since earlier this month.  CT scan concerning for possible intraluminal extravasation of the contrast in the rectum. -Acute blood loss anemia -Liver lesion.  CT scan  yesterday showed 1.1 cm hypoattenuating lesion within the dome of the right lobe of the liver.  Normal LFTs.  Normal INR. -Recommend outpatient MRI abdomen  Recommendations -------------------------- -Plan for unsedated flexible sigmoidoscopy today -1 Fleet enema -If flexible sigmoidoscopy negative, may consider full colonoscopy tomorrow versus CT angiogram -Recommend outpatient MRI for further evaluation of liver lesion once acute issues are resolved  Risks (bleeding, infection, bowel perforation that could require surgery, sedation-related changes in  cardiopulmonary systems), benefits (identification and possible treatment of source of symptoms, exclusion of certain causes of symptoms), and alternatives (watchful waiting, radiographic imaging studies, empiric medical treatment)  were explained to patient  in detail and patient wishes to proceed.   LOS: 0 days   Otis Brace  MD, FACP 08/31/2019, 9:26 AM  Contact #  [phone]

## 2019-08-31 NOTE — Plan of Care (Signed)
  Problem: Education: Goal: Knowledge of General Education information will improve Description Including pain rating scale, medication(s)/side effects and non-pharmacologic comfort measures Outcome: Progressing   

## 2019-08-31 NOTE — Progress Notes (Signed)
Pt c/o being dizzy. Pt has had 4 large bright red bloody bowel movements since 7pm. 98.1-96-18-117/79. Dr. Sidney Ace notified. Orders received.

## 2019-08-31 NOTE — Progress Notes (Signed)
PROGRESS NOTE    Nicholas Hines  QPY:[id number] DOB: [birth year] DOA: 08/30/2019 PCP: Patient, No Pcp Per     Brief Narrative:  86 y.o. WM PMHx BPH, HLD, HTN, gout and internal hemorrhoids;   Presented to the emergency department secondary to rectal pain and bright red blood per rectum.  Patient reports having approximately a month of rectal bleeding for what he had follow-up with his gastroenterologist in [facility]).  He expressed that despite multiple treatments he continued to experience sudden intermittent bright red blood per rectum.  Around 2 AM in the morning today prior to come to the emergency department he developed the largest amount of bright red blood that he had ever seen throughout the length of his condition; he also reports associated rectal pain.  Patient reports no fever, no chills, no nausea, no vomiting, no abdominal pain, no focal weakness, no headaches, no dysuria or hematuria, no other complaints.  ED Course: Patient case was discussed with radiologist who recommends a multiphase CT scan in several hours prior to interventional radiology's intervention as recommended by GI service.  ED physician I spoke with gastroenterologist from [place] on call who recommended patient to be admitted and [facility] for further evaluation, management and potential IR intervention if needed.  Patient was hemodynamically stable; gentle fluid resuscitation started and was, type and screened.   Subjective: A/O x4, negative CP, negative S OB, negative abdominal pain.   Assessment & Plan: Covid vaccination;   Principal Problem:   Rectal bleeding Active Problems:   HLD (hyperlipidemia)   HTN (hypertension)   BPH (benign prostatic hyperplasia)   Gout   Allergic rhinitis   GERD (gastroesophageal reflux disease)   Rectal mass/Rectal bleeding -Patient understands that mass may be malignant.  Informed that it may take 3 to 4 days for cytology and pathology to return  with answer. -If patient stable overnight i.e. able to eat negative further rectal bleeding will discharge in the a.m. and arrange follow-up with GI to discuss results of biopsy  HTN, -Maxide 75-50 mg 1 tablet daily  HLD -Simvastatin 40 mg daily -Lipid panel pending  Gout -Purinol 100 mg daily -Uric acid pending  Hypokalemia -K-Dur 40 mEq   DVT prophylaxis: SCD Code Status: Full Family Communication:  Status is: Inpatient    Dispo: The patient is from: Home              Anticipated d/c is to: Home              Anticipated d/c date is: 6/28              Patient currently unstable s/p procedure      Consultants:  GI   Procedures/Significant Events:  6/27 flexible sigmoidoscopy;-the perianal and digital rectal examinations were normal. -A 10 mm polyp was found in the sigmoid colon ( ? lipoma) . The polyp was semi-pedunculated. Biopsies were taken with a cold forceps for histology. -Ulcerated non-obstructing medium-sized mass was found in the distal rectum.  Likely malignant tumor.    I have personally reviewed and interpreted all radiology studies and my findings are as above.  VENTILATOR SETTINGS:    Cultures 6/27 rectal mass pending   Antimicrobials: Anti-infectives (From admission, onward)   None       Devices    LINES / TUBES:      Continuous Infusions: . sodium chloride 100 mL/hr at 08/31/19 0030     Objective: Vitals:   08/31/19 0939 08/31/19 1100  08/31/19 1125 08/31/19 1200  BP: 119/65 131/83 137/77 119/73  Pulse: 87 85 87 79  Resp: 18 (!) 22 14 18   Temp: 98 F (36.7 C)   97.6 F (36.4 C)  TempSrc: Oral   Oral  SpO2: 98% 98% 99% 100%  Weight:      Height:        Intake/Output Summary (Last 24 hours) at 08/31/2019 1324 Last data filed at 08/31/2019 0600 Gross per 24 hour  Intake 2080.35 ml  Output 9 ml  Net 2071.35 ml   Filed Weights   08/30/19 0612  Weight: 90.7 kg    Examination:  General: A/O x4, no acute  respiratory distress Eyes: negative scleral hemorrhage, negative anisocoria, negative icterus ENT: Negative Runny nose, negative gingival bleeding, Neck:  Negative scars, masses, torticollis, lymphadenopathy, JVD Lungs: Clear to auscultation bilaterally without wheezes or crackles Cardiovascular: Regular rate and rhythm without murmur gallop or rub normal S1 and S2 Abdomen: negative abdominal pain, nondistended, positive soft, bowel sounds, no rebound, no ascites, no appreciable mass Extremities: No significant cyanosis, clubbing, or edema bilateral lower extremities Skin: Negative rashes, lesions, ulcers Psychiatric:  Negative depression, negative anxiety, negative fatigue, negative mania  Central nervous system:  Cranial nerves II through XII intact, tongue/uvula midline, all extremities muscle strength 5/5, sensation intact throughout, negative dysarthria, negative expressive aphasia, negative receptive aphasia.  .     Data Reviewed: Care during the described time interval was provided by me .  I have reviewed this patient's available data, including medical history, events of note, physical examination, and all test results as part of my evaluation.  CBC: Recent Labs  Lab 08/30/19 0617 08/30/19 1459 08/31/19 0141 08/31/19 0554 08/31/19 1201  WBC 5.2 5.4  --   --  5.9  NEUTROABS 3.7  --   --   --   --   HGB 15.1 13.4 11.3* 10.9* 10.9*  HCT 45.3 40.7 34.1* 33.4* 33.2*  MCV 96.2 96.9  --   --  96.2  PLT 162 148*  --   --  503*   Basic Metabolic Panel: Recent Labs  Lab 08/30/19 0617  NA 138  K 3.2*  CL 99  CO2 27  GLUCOSE 121*  BUN 15  CREATININE 0.92  CALCIUM 8.9   GFR: Estimated Creatinine Clearance: 66.8 mL/min (by C-G formula based on SCr of 0.92 mg/dL). Liver Function Tests: Recent Labs  Lab 08/30/19 0617  AST 21  ALT 21  ALKPHOS 53  BILITOT 0.8  PROT 6.3*  ALBUMIN 4.0   No results for input(s): LIPASE, AMYLASE in the last 168 hours. No results for  input(s): AMMONIA in the last 168 hours. Coagulation Profile: Recent Labs  Lab 08/30/19 0617  INR 1.0   Cardiac Enzymes: No results for input(s): CKTOTAL, CKMB, CKMBINDEX, TROPONINI in the last 168 hours. BNP (last 3 results) No results for input(s): PROBNP in the last 8760 hours. HbA1C: No results for input(s): HGBA1C in the last 72 hours. CBG: No results for input(s): GLUCAP in the last 168 hours. Lipid Profile: No results for input(s): CHOL, HDL, LDLCALC, TRIG, CHOLHDL, LDLDIRECT in the last 72 hours. Thyroid Function Tests: No results for input(s): TSH, T4TOTAL, FREET4, T3FREE, THYROIDAB in the last 72 hours. Anemia Panel: No results for input(s): VITAMINB12, FOLATE, FERRITIN, TIBC, IRON, RETICCTPCT in the last 72 hours. Sepsis Labs: No results for input(s): PROCALCITON, LATICACIDVEN in the last 168 hours.  Recent Results (from the past 240 hour(s))  SARS Coronavirus 2 by RT  PCR (hospital order, performed in [facility] hospital lab) Nasopharyngeal Nasopharyngeal Swab     Status: None   Collection Time: 08/30/19  9:37 AM   Specimen: Nasopharyngeal Swab  Result Value Ref Range Status   SARS Coronavirus 2 NEGATIVE NEGATIVE Final    Comment: (NOTE) SARS-CoV-2 target nucleic acids are NOT DETECTED.  The SARS-CoV-2 RNA is generally detectable in upper and lower respiratory specimens during the acute phase of infection. The lowest concentration of SARS-CoV-2 viral copies this assay can detect is 250 copies / mL. A negative result does not preclude SARS-CoV-2 infection and should not be used as the sole basis for treatment or other patient management decisions.  A negative result may occur with improper specimen collection / handling, submission of specimen other than nasopharyngeal swab, presence of viral mutation(s) within the areas targeted by this assay, and inadequate number of viral copies (<250 copies / mL). A negative result must be combined with clinical observations,  patient history, and epidemiological information.  Fact Sheet for Patients:   StrictlyIdeas.no  Fact Sheet for Healthcare Providers: BankingDealers.co.za  This test is not yet approved or  cleared by the [place] FDA and has been authorized for detection and/or diagnosis of SARS-CoV-2 by FDA under an Emergency Use Authorization (EUA).  This EUA will remain in effect (meaning this test can be used) for the duration of the COVID-19 declaration under Section 564(b)(1) of the Act, 21 U.S.C. section 360bbb-3(b)(1), unless the authorization is terminated or revoked sooner.  Performed at [facility], [address] [place], [place] [address]          Radiology Studies: CT ABDOMEN PELVIS W CONTRAST  Result Date: 08/30/2019 CLINICAL DATA:  Melena.  Rectal pain. EXAM: CT ABDOMEN AND PELVIS WITH CONTRAST TECHNIQUE: Multidetector CT imaging of the abdomen and pelvis was performed using the standard protocol following bolus administration of intravenous contrast. CONTRAST:  16mL OMNIPAQUE IOHEXOL 300 MG/ML  SOLN COMPARISON:  01/30/2018 FINDINGS: Lower chest: Limited visualization of the lower thorax demonstrates minimal dependent subpleural ground-glass atelectasis, left greater than right. No discrete focal airspace opacities. No pleural effusion. Normal heart size. Coronary artery calcifications. No pericardial effusion. Hepatobiliary: Normal hepatic contour. Interval development of an approximately 1.1 cm hypoattenuating lesion within the dome of the right lobe of the liver, incompletely characterized on present examination. Additional scattered subcentimeter hypoattenuating [place] lesions are too small to accurately characterize though similar to the 2019 examination favored to represent hepatic cysts. Normal appearance of the gallbladder given degree distention. No radiopaque gallstones. No intra or extrahepatic biliary ductal dilatation. No  ascites. Pancreas: Normal appearance of the pancreas. Spleen: Normal appearance of the spleen. Adrenals/Urinary Tract: There is symmetric enhancement and excretion of the bilateral kidneys. Redemonstrated left-sided renal cysts with dominant partially exophytic cyst arising from the inferolateral aspect the left kidney measuring 5.3 cm in diameter. No discrete right-sided renal lesions. Note is made of 4 punctate (sub 4 mm) left-sided renal stones (coronal images 68, 70, 77 and 79, series 5 as well as a solitary 3 mm nonobstructing right-sided renal stone (coronal image 59, series 5). Minimal grossly symmetric bilateral perinephric stranding. No evidence of urinary obstruction. Normal appearance the bilateral adrenal glands. There is mass effect of the prostate gland on the undersurface of the urinary bladder. Otherwise, normal appearance of the urinary bladder given degree of distention. Stomach/Bowel: Linear high density debris seen within the rectum (coronal image 95, series 5; axial images 80 through 85, series 2, potentially ingested radiopaque debris  though an area of intraluminal contrast extravasation could have a similar appearance. Note, pre contrast and delayed contrast images were not obtained. Scattered colonic diverticulosis without evidence of superimposed acute diverticulitis. Moderate colonic stool burden without evidence of enteric obstruction. Normal appearance of the terminal ileum and the retrocecal appendix. No discrete areas of bowel wall thickening. No pneumoperitoneum, pneumatosis or portal venous gas. Vascular/Lymphatic: Minimal amount of mixed calcified and noncalcified atherosclerotic plaque throughout the normal caliber abdominal aorta, not resulting in a hemodynamically significant stenosis on this non CTA examination. 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 No bulky retroperitoneal, mesenteric, pelvic or inguinal lymphadenopathy. Reproductive: Prostatomegaly, in  particular, hypertrophy with mass effect upon the undersurface of the urinary bladder. Other: Small mesenteric fat containing periumbilical hernia. There is a minimal amount of subcutaneous edema about the midline of the low back. Musculoskeletal: Stigmata of dish within the thoracic spine. Mild to moderate multilevel lumbar spine DDD, worse at L5-S1 with disc space height loss, endplate irregularity and sclerosis. IMPRESSION: 1. Linear radiopaque debris within the rectum, potentially ingested debris, though intraluminal contrast extravasation could have a similar appearance, incompletely evaluated on this non multiphase examination. Clinical correlation is advised. Further evaluation with multiphase GI bleeding protocol CT scan of the abdomen pelvis could be performed as indicated. 2. Colonic diverticulosis without evidence superimposed acute diverticulitis. 3. Small hiatal hernia. 4. Nonobstructing bilateral nephrolithiasis, left greater than right. 5. Coronary calcifications.  Aortic Atherosclerosis (ICD10-I70.0). 6. Interval development of an approximately 1.1 cm hypoattenuating lesion within the dome of the right lobe of the liver, not seen on the 2019 examination though incompletely characterized on the present examination. Further evaluation with nonemergent abdominal MRI could be performed as indicated. Critical Value/emergent results were called by telephone at the time of interpretation on 08/30/2019 at 9:07 am to provider [facility] , who verbally acknowledged these results. Electronically Signed   By: Sandi Mariscal M.D.   On: 08/30/2019 09:12        Scheduled Meds: . allopurinol  100 mg Oral Daily  . budesonide  3 mg Oral Daily  . doxazosin  8 mg Oral QHS  . finasteride  5 mg Oral Daily  . lidocaine   Topical BID  . loratadine  10 mg Oral Daily  . pantoprazole  40 mg Oral Daily  . simvastatin  40 mg Oral q1800  . triamterene-hydrochlorothiazide  1 tablet Oral Daily   Continuous  Infusions: . sodium chloride 100 mL/hr at 08/31/19 0030     LOS: 0 days    Time spent:40 min    Kaitrin Seybold, Geraldo Docker, MD [facility] Pager [phone]  If 7PM-7AM, please contact night-coverage [email or web address] Password [email or web address] 08/31/2019, 1:24 PM

## 2019-08-31 NOTE — Op Note (Signed)
[facility] Patient Name: Nicholas Hines Procedure Date : 08/31/2019 MRN: [id number] Attending MD: Otis Brace , MD Date of Birth: 03/05/1934 CSN: [id number] Age: 86 Admit Type: Inpatient Procedure:                Flexible Sigmoidoscopy Indications:              Rectal hemorrhage Providers:                Otis Brace, MD, Vista Lawman, RN, William Dalton, Technician Referring MD:              Medicines:                None Complications:            No immediate complications. Estimated Blood Loss:     Estimated blood loss was minimal. Procedure:                Pre-Anesthesia Assessment:                           - Prior to the procedure, a History and Physical                            was performed, and patient medications and                            allergies were reviewed. The patient's tolerance of                            previous anesthesia was also reviewed. The risks                            and benefits of the procedure and the sedation                            options and risks were discussed with the patient.                            All questions were answered, and informed consent                            was obtained. Prior Anticoagulants: The patient has                            taken no previous anticoagulant or antiplatelet                            agents. After reviewing the risks and benefits, the                            patient was deemed in satisfactory condition to                            undergo the procedure.  After obtaining informed consent, the scope was                            passed under direct vision. The PCF-H190DL                            ([id number]) Olympus pediatric colonscope was                            introduced through the anus and advanced to the 30                            cm from the anal verge. The flexible sigmoidoscopy                             was accomplished without difficulty. The patient                            tolerated the procedure well. The quality of the                            bowel preparation was adequate. Scope In: 11:10:52 AM Scope Out: 11:21:42 AM Total Procedure Duration: 0 hours 10 minutes 50 seconds  Findings:      The perianal and digital rectal examinations were normal.      A 10 mm polyp was found in the sigmoid colon ( ? lipoma) . The polyp was       semi-pedunculated. Biopsies were taken with a cold forceps for histology.      An ulcerated non-obstructing medium-sized mass was found in the distal       rectum. The mass was non-circumferential. The mass measured three cm in       length. Biopsies were taken with a cold forceps for histology. Impression:               - One 10 mm polyp in the sigmoid colon. Biopsied.                           - Likely malignant tumor in the distal rectum.                            Biopsied. Recommendation:           - Return patient to hospital ward for ongoing care.                           - Resume regular diet.                           - Await pathology results. Procedure Code(s):        --- Professional ---                           [phone], Sigmoidoscopy, flexible; with biopsy, single                            or  multiple Diagnosis Code(s):        --- Professional ---                           K63.5, Polyp of colon                           D49.0, Neoplasm of unspecified behavior of                            digestive system                           K62.5, Hemorrhage of anus and rectum CPT copyright 2019 American Medical Association. All rights reserved. The codes documented in this report are preliminary and upon coder review may  be revised to meet current compliance requirements. Otis Brace, MD Otis Brace, MD 08/31/2019 12:45:48 PM Number of Addenda: 0

## 2019-08-31 NOTE — Brief Op Note (Signed)
08/30/2019 - 08/31/2019  12:48 PM  PATIENT:  Nicholas Hines  86 y.o. male  PRE-OPERATIVE DIAGNOSIS:  rectal bleeding  POST-OPERATIVE DIAGNOSIS:  rectal ulcer; sigmoid polyp vs. lipoma  PROCEDURE:  Procedure(s): FLEXIBLE SIGMOIDOSCOPY (N/A) BIOPSY  SURGEON:  Surgeon(s) and Role:    * Mitesh Rosendahl, MD - Primary  Findings ----------- -Flexible sigmoidoscopy showed distal rectal mass concerning for malignancy.  Also showed possible lipoma/polyp in the sigmoid colon.  Biopsies performed.  Recommendations ------------------------ -Start lidocaine topical for rectal discomfort -Advance diet -Findings discussed with patient sister over the phone ( retired Marine scientist ) -Findings also discussed with the patient. -Monitor H&H -GI will follow  Otis Brace MD, [place] 08/31/2019, 12:50 PM  Contact #  [phone]

## 2019-09-01 ENCOUNTER — Encounter (HOSPITAL_COMMUNITY): Payer: Self-pay | Admitting: Gastroenterology

## 2019-09-01 LAB — LIPID PANEL
Cholesterol: 83 mg/dL (ref 0–200)
HDL: 30 mg/dL — ABNORMAL LOW (ref >40)
LDL Cholesterol: 46 mg/dL (ref 0–99)
Total CHOL/HDL Ratio: 2.8 RATIO
Triglycerides: 37 mg/dL (ref <150)
VLDL: 7 mg/dL (ref 0–40)

## 2019-09-01 LAB — HEMOGLOBIN AND HEMATOCRIT, BLOOD
HCT: 30.4 % — ABNORMAL LOW (ref 39.0–52.0)
HCT: 32.6 % — ABNORMAL LOW (ref 39.0–52.0)
HCT: 31.1 % — ABNORMAL LOW (ref 39.0–52.0)
Hemoglobin: 10 g/dL — ABNORMAL LOW (ref 13.0–17.0)
Hemoglobin: 10.8 g/dL — ABNORMAL LOW (ref 13.0–17.0)
Hemoglobin: 10.6 g/dL — ABNORMAL LOW (ref 13.0–17.0)

## 2019-09-01 LAB — CBC WITH DIFFERENTIAL/PLATELET
Abs Immature Granulocytes: 0.02 10*3/uL (ref 0.00–0.07)
Basophils Absolute: 0 10*3/uL (ref 0.0–0.1)
Basophils Relative: 1 %
Eosinophils Absolute: 0.2 10*3/uL (ref 0.0–0.5)
Eosinophils Relative: 3 %
HCT: 29.5 % — ABNORMAL LOW (ref 39.0–52.0)
Hemoglobin: 9.6 g/dL — ABNORMAL LOW (ref 13.0–17.0)
Immature Granulocytes: 0 %
Lymphocytes Relative: 12 %
Lymphs Abs: 0.6 10*3/uL — ABNORMAL LOW (ref 0.7–4.0)
MCH: 31.8 pg (ref 26.0–34.0)
MCHC: 32.5 g/dL (ref 30.0–36.0)
MCV: 97.7 fL (ref 80.0–100.0)
Monocytes Absolute: 0.6 10*3/uL (ref 0.1–1.0)
Monocytes Relative: 12 %
Neutro Abs: 3.7 10*3/uL (ref 1.7–7.7)
Neutrophils Relative %: 72 %
Platelets: 130 10*3/uL — ABNORMAL LOW (ref 150–400)
RBC: 3.02 MIL/uL — ABNORMAL LOW (ref 4.22–5.81)
RDW: 13.2 % (ref 11.5–15.5)
WBC: 5.1 10*3/uL (ref 4.0–10.5)
nRBC: 0 % (ref 0.0–0.2)

## 2019-09-01 LAB — COMPREHENSIVE METABOLIC PANEL
ALT: 15 U/L (ref 0–44)
AST: 15 U/L (ref 15–41)
Albumin: 2.8 g/dL — ABNORMAL LOW (ref 3.5–5.0)
Alkaline Phosphatase: 36 U/L — ABNORMAL LOW (ref 38–126)
Anion gap: 7 (ref 5–15)
BUN: 12 mg/dL (ref 8–23)
CO2: 26 mmol/L (ref 22–32)
Calcium: 7.9 mg/dL — ABNORMAL LOW (ref 8.9–10.3)
Chloride: 108 mmol/L (ref 98–111)
Creatinine, Ser: 0.94 mg/dL (ref 0.61–1.24)
GFR calc Af Amer: >60 mL/min (ref >60)
GFR calc non Af Amer: >60 mL/min (ref >60)
Glucose, Bld: 115 mg/dL — ABNORMAL HIGH (ref 70–99)
Potassium: 3.3 mmol/L — ABNORMAL LOW (ref 3.5–5.1)
Sodium: 141 mmol/L (ref 135–145)
Total Bilirubin: 0.8 mg/dL (ref 0.3–1.2)
Total Protein: 4.4 g/dL — ABNORMAL LOW (ref 6.5–8.1)

## 2019-09-01 LAB — POC OCCULT BLOOD, ED: Fecal Occult Bld: POSITIVE — AB

## 2019-09-01 LAB — MAGNESIUM: Magnesium: 1.4 mg/dL — ABNORMAL LOW (ref 1.7–2.4)

## 2019-09-01 LAB — URIC ACID: Uric Acid, Serum: 6.1 mg/dL (ref 3.7–8.6)

## 2019-09-01 LAB — PHOSPHORUS: Phosphorus: 2.9 mg/dL (ref 2.5–4.6)

## 2019-09-01 MED ORDER — POTASSIUM CHLORIDE CRYS ER 20 MEQ PO TBCR
40.0000 meq | EXTENDED_RELEASE_TABLET | Freq: Two times a day (BID) | ORAL | Status: AC
  Administered 2019-09-01 (×2): 40 meq via ORAL
  Filled 2019-09-01 (×2): qty 2

## 2019-09-01 MED ORDER — MAGNESIUM SULFATE 50 % IJ SOLN
3.0000 g | Freq: Once | INTRAVENOUS | Status: AC
  Administered 2019-09-01: 3 g via INTRAVENOUS
  Filled 2019-09-01: qty 6

## 2019-09-01 NOTE — Plan of Care (Signed)
  Problem: Elimination: Goal: Will not experience complications related to bowel motility Outcome: Progressing   

## 2019-09-01 NOTE — Plan of Care (Signed)
  Problem: Elimination: Goal: Will not experience complications related to bowel motility Outcome: Adequate for Discharge   

## 2019-09-01 NOTE — Progress Notes (Signed)
[facility]astroenterology Progress Note  Nicholas Hines 86 y.o. 1933/09/06  CC: Rectal bleeding   Subjective: Patient seen and examined at bedside.  Sitting in a chair comfortably without any distress.  Feeling some weakness.  Denies any further bleeding.  Denies nausea or vomiting.  Mild left lower quadrant discomfort  ROS : Afebrile.  Positive for fatigue and weakness   Objective: Vital signs in last 24 hours: Vitals:   09/01/19 0558 09/01/19 0900  BP: 133/76 109/75  Pulse: 85 97  Resp: 18 18  Temp: 98 F (36.7 C) 98.3 F (36.8 C)  SpO2: 98% 97%    Physical Exam:  General:  Alert, cooperative, no distress, appears stated age  Head:  Normocephalic, without obvious abnormality, atraumatic  Eyes:  , EOM's intact,   Lungs:    No respiratory distress noted  Heart:  Regular rate and rhythm, S1, S2 normal  Abdomen:   Soft, non-tender, nondistended, bowel sounds present  Extremities: Extremities normal, atraumatic, no  edema       Lab Results: Recent Labs    08/30/19 0617 09/01/19 0609  NA 138 141  K 3.2* 3.3*  CL 99 108  CO2 27 26  GLUCOSE 121* 115*  BUN 15 12  CREATININE 0.92 0.94  CALCIUM 8.9 7.9*  MG  --  1.4*  PHOS  --  2.9   Recent Labs    08/30/19 0617 09/01/19 0609  AST 21 15  ALT 21 15  ALKPHOS 53 36*  BILITOT 0.8 0.8  PROT 6.3* 4.4*  ALBUMIN 4.0 2.8*   Recent Labs    08/30/19 0617 08/30/19 1459 08/31/19 1201 08/31/19 1912 09/01/19 0144 09/01/19 0609  WBC 5.2   < > 5.9  --   --  5.1  NEUTROABS 3.7  --   --   --   --  3.7  HGB 15.1   < > 10.9*   < > 10.0* 9.6*  HCT 45.3   < > 33.2*   < > 30.4* 29.5*  MCV 96.2   < > 96.2  --   --  97.7  PLT 162   < > 144*  --   --  130*   < > = values in this interval not displayed.   Recent Labs    08/30/19 0617  LABPROT 12.3  INR 1.0      Assessment/Plan: -Rectal bleeding.    Flexible sigmoidoscopy yesterday showed ulcerated rectal mass concerning for malignancy.  Biopsies pending. -Liver  lesion.  CT scan yesterday showed 1.1 cm hypoattenuating lesion within the dome of the right lobe of the liver.  Normal LFTs.  Normal INR. -Recommend outpatient MRI abdomen  Recommendations ------------------------- -Check CEA -Get MRI liver for further evaluation of liver lesion -Patient and patient's sister are aware of rectal mass and if surgery is needed they prefer to get it done at [facility]. -Monitor H&H. -Hopefully discharge tomorrow   Otis Brace MD, [place] 09/01/2019, 11:01 AM  Contact #  [phone]

## 2019-09-01 NOTE — Progress Notes (Signed)
PROGRESS NOTE    Nicholas Hines  WUJ:[id number] DOB: 1934-01-03 DOA: 08/30/2019 PCP: Patient, No Pcp Per     Brief Narrative:  86 y.o. WM PMHx BPH, HLD, HTN, gout and internal hemorrhoids;   Presented to the emergency department secondary to rectal pain and bright red blood per rectum.  Patient reports having approximately a month of rectal bleeding for what he had follow-up with his gastroenterologist in [facility]).  He expressed that despite multiple treatments he continued to experience sudden intermittent bright red blood per rectum.  Around 2 AM in the morning today prior to come to the emergency department he developed the largest amount of bright red blood that he had ever seen throughout the length of his condition; he also reports associated rectal pain.  Patient reports no fever, no chills, no nausea, no vomiting, no abdominal pain, no focal weakness, no headaches, no dysuria or hematuria, no other complaints.  ED Course: Patient case was discussed with radiologist who recommends a multiphase CT scan in several hours prior to interventional radiology's intervention as recommended by GI service.  ED physician I spoke with gastroenterologist from [place] on call who recommended patient to be admitted and [facility] for further evaluation, management and potential IR intervention if needed.  Patient was hemodynamically stable; gentle fluid resuscitation started and was, type and screened.   Subjective: 6/28    A/O x4, negative CP, negative S OB, negative abdominal pain.   Assessment & Plan: Covid vaccination;   Principal Problem:   Rectal bleeding Active Problems:   HLD (hyperlipidemia)   HTN (hypertension)   BPH (benign prostatic hyperplasia)   Gout   Allergic rhinitis   GERD (gastroesophageal reflux disease)   Rectal mass/Rectal bleeding -Patient understands that mass may be malignant.  Informed that it may take 3 to 4 days for cytology and pathology  to return with answer. -If patient stable overnight i.e. able to eat negative further rectal bleeding will discharge in the a.m. and arrange follow-up with GI to discuss results of biopsy -Dr. Alessandra Bevels GI requested that patient remain overnight for additional studies to include MRI of liver.  HTN, -Maxide 75-50 mg 1 tablet daily  HLD -Simvastatin 40 mg daily -Lipid panel pending  Gout -Purinol 100 mg daily -Uric acid pending  Hypokalemia -K-Dur 40 mEq x 2  Hypomagnesmia -Magnesium IV 3 g   DVT prophylaxis: SCD Code Status: Full Family Communication:  Status is: Inpatient    Dispo: The patient is from: Home              Anticipated d/c is to: Home              Anticipated d/c date is: 6/28              Patient currently unstable s/p procedure      Consultants:  GI   Procedures/Significant Events:  6/27 flexible sigmoidoscopy;-the perianal and digital rectal examinations were normal. -A 10 mm polyp was found in the sigmoid colon ( ? lipoma) . The polyp was semi-pedunculated. Biopsies were taken with a cold forceps for histology. -Ulcerated non-obstructing medium-sized mass was found in the distal rectum.  Likely malignant tumor.    I have personally reviewed and interpreted all radiology studies and my findings are as above.  VENTILATOR SETTINGS:    Cultures 6/27 rectal mass pending   Antimicrobials: Anti-infectives (From admission, onward)   None       Devices    LINES /  TUBES:      Continuous Infusions:  sodium chloride 100 mL/hr at 08/31/19 1507     Objective: Vitals:   08/31/19 1811 08/31/19 2139 09/01/19 0558 09/01/19 0900  BP: 109/73 131/74 133/76 109/75  Pulse: (!) 105 92 85 97  Resp: 16 18 18 18   Temp: 98.7 F (37.1 C) 99 F (37.2 C) 98 F (36.7 C) 98.3 F (36.8 C)  TempSrc: Oral Oral Oral Oral  SpO2: 100% 96% 98% 97%  Weight:      Height:        Intake/Output Summary (Last 24 hours) at 09/01/2019 1116 Last data  filed at 09/01/2019 0900 Gross per 24 hour  Intake 2379.19 ml  Output 0 ml  Net 2379.19 ml   Filed Weights   08/30/19 0612  Weight: 90.7 kg    Examination:  General: A/O x4, no acute respiratory distress Eyes: negative scleral hemorrhage, negative anisocoria, negative icterus ENT: Negative Runny nose, negative gingival bleeding, Neck:  Negative scars, masses, torticollis, lymphadenopathy, JVD Lungs: Clear to auscultation bilaterally without wheezes or crackles Cardiovascular: Regular rate and rhythm without murmur gallop or rub normal S1 and S2 Abdomen: negative abdominal pain, nondistended, positive soft, bowel sounds, no rebound, no ascites, no appreciable mass Extremities: No significant cyanosis, clubbing, or edema bilateral lower extremities Skin: Negative rashes, lesions, ulcers Psychiatric:  Negative depression, negative anxiety, negative fatigue, negative mania  Central nervous system:  Cranial nerves II through XII intact, tongue/uvula midline, all extremities muscle strength 5/5, sensation intact throughout, negative dysarthria, negative expressive aphasia, negative receptive aphasia.  .     Data Reviewed: Care during the described time interval was provided by me .  I have reviewed this patient's available data, including medical history, events of note, physical examination, and all test results as part of my evaluation.  CBC: Recent Labs  Lab 08/30/19 0617 08/30/19 0617 08/30/19 1459 08/31/19 0141 08/31/19 0554 08/31/19 1201 08/31/19 1912 09/01/19 0144 09/01/19 0609  WBC 5.2  --  5.4  --   --  5.9  --   --  5.1  NEUTROABS 3.7  --   --   --   --   --   --   --  3.7  HGB 15.1   < > 13.4   < > 10.9* 10.9* 10.0* 10.0* 9.6*  HCT 45.3   < > 40.7   < > 33.4* 33.2* 30.4* 30.4* 29.5*  MCV 96.2  --  96.9  --   --  96.2  --   --  97.7  PLT 162  --  148*  --   --  144*  --   --  130*   < > = values in this interval not displayed.   Basic Metabolic Panel: Recent  Labs  Lab 08/30/19 0617 09/01/19 0609  NA 138 141  K 3.2* 3.3*  CL 99 108  CO2 27 26  GLUCOSE 121* 115*  BUN 15 12  CREATININE 0.92 0.94  CALCIUM 8.9 7.9*  MG  --  1.4*  PHOS  --  2.9   GFR: Estimated Creatinine Clearance: 65.4 mL/min (by C-G formula based on SCr of 0.94 mg/dL). Liver Function Tests: Recent Labs  Lab 08/30/19 0617 09/01/19 0609  AST 21 15  ALT 21 15  ALKPHOS 53 36*  BILITOT 0.8 0.8  PROT 6.3* 4.4*  ALBUMIN 4.0 2.8*   No results for input(s): LIPASE, AMYLASE in the last 168 hours. No results for input(s): AMMONIA in the last 168  hours. Coagulation Profile: Recent Labs  Lab 08/30/19 0617  INR 1.0   Cardiac Enzymes: No results for input(s): CKTOTAL, CKMB, CKMBINDEX, TROPONINI in the last 168 hours. BNP (last 3 results) No results for input(s): PROBNP in the last 8760 hours. HbA1C: No results for input(s): HGBA1C in the last 72 hours. CBG: No results for input(s): GLUCAP in the last 168 hours. Lipid Profile: Recent Labs    09/01/19 0144  CHOL 83  HDL 30*  LDLCALC 46  TRIG 37  CHOLHDL 2.8   Thyroid Function Tests: No results for input(s): TSH, T4TOTAL, FREET4, T3FREE, THYROIDAB in the last 72 hours. Anemia Panel: No results for input(s): VITAMINB12, FOLATE, FERRITIN, TIBC, IRON, RETICCTPCT in the last 72 hours. Sepsis Labs: No results for input(s): PROCALCITON, LATICACIDVEN in the last 168 hours.  Recent Results (from the past 240 hour(s))  SARS Coronavirus 2 by RT PCR (hospital order, performed in [facility] hospital lab) Nasopharyngeal Nasopharyngeal Swab     Status: None   Collection Time: 08/30/19  9:37 AM   Specimen: Nasopharyngeal Swab  Result Value Ref Range Status   SARS Coronavirus 2 NEGATIVE NEGATIVE Final    Comment: (NOTE) SARS-CoV-2 target nucleic acids are NOT DETECTED.  The SARS-CoV-2 RNA is generally detectable in upper and lower respiratory specimens during the acute phase of infection. The lowest concentration of  SARS-CoV-2 viral copies this assay can detect is 250 copies / mL. A negative result does not preclude SARS-CoV-2 infection and should not be used as the sole basis for treatment or other patient management decisions.  A negative result may occur with improper specimen collection / handling, submission of specimen other than nasopharyngeal swab, presence of viral mutation(s) within the areas targeted by this assay, and inadequate number of viral copies (<250 copies / mL). A negative result must be combined with clinical observations, patient history, and epidemiological information.  Fact Sheet for Patients:   StrictlyIdeas.no  Fact Sheet for Healthcare Providers: BankingDealers.co.za  This test is not yet approved or  cleared by the [place] FDA and has been authorized for detection and/or diagnosis of SARS-CoV-2 by FDA under an Emergency Use Authorization (EUA).  This EUA will remain in effect (meaning this test can be used) for the duration of the COVID-19 declaration under Section 564(b)(1) of the Act, 21 U.S.C. section 360bbb-3(b)(1), unless the authorization is terminated or revoked sooner.  Performed at [facility], [address], [place], [place] [address]          Radiology Studies: No results found.      Scheduled Meds:  allopurinol  100 mg Oral Daily   budesonide  3 mg Oral Daily   doxazosin  8 mg Oral QHS   finasteride  5 mg Oral Daily   lidocaine   Topical BID   loratadine  10 mg Oral Daily   pantoprazole  40 mg Oral Daily   simvastatin  40 mg Oral q1800   triamterene-hydrochlorothiazide  1 tablet Oral Daily   Continuous Infusions:  sodium chloride 100 mL/hr at 08/31/19 1507     LOS: 1 day    Time spent:40 min    Jessicah Croll, Geraldo Docker, MD [facility] Pager [phone]  If 7PM-7AM, please contact night-coverage [email or web address] Password [employer] 09/01/2019, 11:16 AM

## 2019-09-02 ENCOUNTER — Inpatient Hospital Stay (HOSPITAL_COMMUNITY): Payer: Medicare Other

## 2019-09-02 DIAGNOSIS — K769 Liver disease, unspecified: Secondary | ICD-10-CM | POA: Diagnosis present

## 2019-09-02 LAB — COMPREHENSIVE METABOLIC PANEL
ALT: 17 U/L (ref 0–44)
AST: 18 U/L (ref 15–41)
Albumin: 3.1 g/dL — ABNORMAL LOW (ref 3.5–5.0)
Alkaline Phosphatase: 38 U/L (ref 38–126)
Anion gap: 9 (ref 5–15)
BUN: 8 mg/dL (ref 8–23)
CO2: 26 mmol/L (ref 22–32)
Calcium: 8.1 mg/dL — ABNORMAL LOW (ref 8.9–10.3)
Chloride: 104 mmol/L (ref 98–111)
Creatinine, Ser: 0.93 mg/dL (ref 0.61–1.24)
GFR calc Af Amer: >60 mL/min (ref >60)
GFR calc non Af Amer: >60 mL/min (ref >60)
Glucose, Bld: 108 mg/dL — ABNORMAL HIGH (ref 70–99)
Potassium: 3.1 mmol/L — ABNORMAL LOW (ref 3.5–5.1)
Sodium: 139 mmol/L (ref 135–145)
Total Bilirubin: 0.8 mg/dL (ref 0.3–1.2)
Total Protein: 5.1 g/dL — ABNORMAL LOW (ref 6.5–8.1)

## 2019-09-02 LAB — HEMOGLOBIN AND HEMATOCRIT, BLOOD
HCT: 29.2 % — ABNORMAL LOW (ref 39.0–52.0)
HCT: 30.9 % — ABNORMAL LOW (ref 39.0–52.0)
HCT: 31.3 % — ABNORMAL LOW (ref 39.0–52.0)
Hemoglobin: 10.4 g/dL — ABNORMAL LOW (ref 13.0–17.0)
Hemoglobin: 10.4 g/dL — ABNORMAL LOW (ref 13.0–17.0)
Hemoglobin: 9.8 g/dL — ABNORMAL LOW (ref 13.0–17.0)

## 2019-09-02 LAB — MAGNESIUM: Magnesium: 1.7 mg/dL (ref 1.7–2.4)

## 2019-09-02 LAB — CBC WITH DIFFERENTIAL/PLATELET
Abs Immature Granulocytes: 0.03 10*3/uL (ref 0.00–0.07)
Basophils Absolute: 0 10*3/uL (ref 0.0–0.1)
Basophils Relative: 0 %
Eosinophils Absolute: 0.1 10*3/uL (ref 0.0–0.5)
Eosinophils Relative: 2 %
HCT: 30 % — ABNORMAL LOW (ref 39.0–52.0)
Hemoglobin: 10 g/dL — ABNORMAL LOW (ref 13.0–17.0)
Immature Granulocytes: 1 %
Lymphocytes Relative: 13 %
Lymphs Abs: 0.7 10*3/uL (ref 0.7–4.0)
MCH: 31.6 pg (ref 26.0–34.0)
MCHC: 33.3 g/dL (ref 30.0–36.0)
MCV: 94.9 fL (ref 80.0–100.0)
Monocytes Absolute: 0.6 10*3/uL (ref 0.1–1.0)
Monocytes Relative: 11 %
Neutro Abs: 3.9 10*3/uL (ref 1.7–7.7)
Neutrophils Relative %: 73 %
Platelets: 141 10*3/uL — ABNORMAL LOW (ref 150–400)
RBC: 3.16 MIL/uL — ABNORMAL LOW (ref 4.22–5.81)
RDW: 12.9 % (ref 11.5–15.5)
WBC: 5.3 10*3/uL (ref 4.0–10.5)
nRBC: 0 % (ref 0.0–0.2)

## 2019-09-02 LAB — PHOSPHORUS: Phosphorus: 2.4 mg/dL — ABNORMAL LOW (ref 2.5–4.6)

## 2019-09-02 LAB — CEA: CEA: 1.4 ng/mL (ref 0.0–4.7)

## 2019-09-02 MED ORDER — MAGNESIUM SULFATE 2 GM/50ML IV SOLN
2.0000 g | Freq: Once | INTRAVENOUS | Status: AC
  Administered 2019-09-02: 2 g via INTRAVENOUS
  Filled 2019-09-02: qty 50

## 2019-09-02 MED ORDER — POTASSIUM CHLORIDE CRYS ER 20 MEQ PO TBCR
40.0000 meq | EXTENDED_RELEASE_TABLET | Freq: Three times a day (TID) | ORAL | Status: AC
  Administered 2019-09-02 (×3): 40 meq via ORAL
  Filled 2019-09-02 (×3): qty 2

## 2019-09-02 MED ORDER — HYDROCORTISONE ACETATE 25 MG RE SUPP
25.0000 mg | Freq: Two times a day (BID) | RECTAL | Status: DC
  Administered 2019-09-02 – 2019-09-07 (×11): 25 mg via RECTAL
  Filled 2019-09-02 (×12): qty 1

## 2019-09-02 MED ORDER — GADOBUTROL 1 MMOL/ML IV SOLN
9.0000 mL | Freq: Once | INTRAVENOUS | Status: AC | PRN
  Administered 2019-09-02: 9 mL via INTRAVENOUS

## 2019-09-02 NOTE — Plan of Care (Signed)
  Problem: Safety: Goal: Ability to remain free from injury will improve Outcome: Progressing   

## 2019-09-02 NOTE — Progress Notes (Signed)
PROGRESS NOTE    Nicholas Hines  NWG:[id number] DOB: Mar 23, 1933 DOA: 08/30/2019 PCP: Patient, No Pcp Per     Brief Narrative:  86 y.o. WM PMHx BPH, HLD, HTN, gout and internal hemorrhoids;   Presented to the emergency department secondary to rectal pain and bright red blood per rectum.  Patient reports having approximately a month of rectal bleeding for what he had follow-up with his gastroenterologist in [facility]).  He expressed that despite multiple treatments he continued to experience sudden intermittent bright red blood per rectum.  Around 2 AM in the morning today prior to come to the emergency department he developed the largest amount of bright red blood that he had ever seen throughout the length of his condition; he also reports associated rectal pain.  Patient reports no fever, no chills, no nausea, no vomiting, no abdominal pain, no focal weakness, no headaches, no dysuria or hematuria, no other complaints.  ED Course: Patient case was discussed with radiologist who recommends a multiphase CT scan in several hours prior to interventional radiology's intervention as recommended by GI service.  ED physician I spoke with gastroenterologist from [place] on call who recommended patient to be admitted and [facility] for further evaluation, management and potential IR intervention if needed.  Patient was hemodynamically stable; gentle fluid resuscitation started and was, type and screened.   Subjective: 6/29 A/O x4, negative CP, negative S OB, negative abdominal pain.  Patient frustrated that he just now has received his MRI for his abdomen.    Assessment & Plan: Covid vaccination;   Principal Problem:   Rectal bleeding Active Problems:   HLD (hyperlipidemia)   HTN (hypertension)   BPH (benign prostatic hyperplasia)   Gout   Allergic rhinitis   GERD (gastroesophageal reflux disease)   Rectal mass/Rectal bleeding -Patient understands that mass may be  malignant.  Informed that it may take 3 to 4 days for cytology and pathology to return with answer. -If patient stable overnight i.e. able to eat negative further rectal bleeding will discharge in the a.m. and arrange follow-up with GI to discuss results of biopsy -Dr. Alessandra Bevels GI requested that patient remain overnight for additional studies to include MRI of liver.  RIGHT lobe Liver lesion -CT scan showed new liver lesion which needs further evaluation with MRI.  See results below -MRI liver demonstrates 1.9 cm rim lesion in the posterior right liver concerning for mets see results below  HTN, -Maxide 75-50 mg 1 tablet daily  HLD -Simvastatin 40 mg daily -Lipid panel pending  Gout -Purinol 100 mg daily -Uric acid pending  Hypokalemia -K-Dur 40 mEq x 2  Hypomagnesmia -Magnesium IV 3 g   DVT prophylaxis: SCD Code Status: Full Family Communication:  Status is: Inpatient    Dispo: The patient is from: Home              Anticipated d/c is to: Home              Anticipated d/c date is: 6/28              Patient currently unstable s/p procedure      Consultants:  GI   Procedures/Significant Events:  6/26 CT abdomen pelvis W contrast;- Colonic diverticulosis without evidence superimposed acute diverticulitis. -Small hiatal hernia. -Nonobstructing bilateral nephrolithiasis, LEFT >> RIGHT . -Interval development of an approximately 1.1 cm hypoattenuating lesion within the dome of the right lobe of the liver, not seen on the 2019 examination.  6/27 flexible  sigmoidoscopy;-the perianal and digital rectal examinations were normal. -A 10 mm polyp was found in the sigmoid colon ( ? lipoma) . The polyp was semi-pedunculated. Biopsies were taken with a cold forceps for histology. -Ulcerated non-obstructing medium-sized mass was found in the distal rectum.  Likely malignant tumor. 6/29 MRI liver  W. Wo contrast;1.9 cm rim lesion in the posterior right liver shows a thin  rim of peripheral rim enhancement. Central aspect of the lesion is cystic/necrotic with evidence of restricted diffusion. Imaging features are concerning for a metastatic lesion. PET-CT may prove helpful to further evaluate. 2. Numerous tiny T2 hyperintensity scattered throughout both hepatic lobes are too small to characterize and likely benign. 3. Bilateral renal cysts.   I have personally reviewed and interpreted all radiology studies and my findings are as above.  VENTILATOR SETTINGS:    Cultures 6/27 rectal mass pending   Antimicrobials: Anti-infectives (From admission, onward)   None       Devices    LINES / TUBES:      Continuous Infusions:    Objective: Vitals:   09/01/19 2038 09/02/19 0459 09/02/19 0933 09/02/19 2028  BP:  136/76 120/69 104/77  Pulse:  88 92 96  Resp:   18 18  Temp:  98.6 F (37 C) 98.3 F (36.8 C) 98.1 F (36.7 C)  TempSrc: Oral Oral    SpO2:  98% 99% 96%  Weight: 91.7 kg     Height:        Intake/Output Summary (Last 24 hours) at 09/02/2019 2051 Last data filed at 09/02/2019 2018 Gross per 24 hour  Intake 937.52 ml  Output 2 ml  Net 935.52 ml   Filed Weights   08/30/19 0612 09/01/19 2038  Weight: 90.7 kg 91.7 kg    Examination:  General: A/O x4, no acute respiratory distress Eyes: negative scleral hemorrhage, negative anisocoria, negative icterus ENT: Negative Runny nose, negative gingival bleeding, Neck:  Negative scars, masses, torticollis, lymphadenopathy, JVD Lungs: Clear to auscultation bilaterally without wheezes or crackles Cardiovascular: Regular rate and rhythm without murmur gallop or rub normal S1 and S2 Abdomen: negative abdominal pain, nondistended, positive soft, bowel sounds, no rebound, no ascites, no appreciable mass Extremities: No significant cyanosis, clubbing, or edema bilateral lower extremities Skin: Negative rashes, lesions, ulcers Psychiatric:  Negative depression, negative anxiety, negative  fatigue, negative mania  Central nervous system:  Cranial nerves II through XII intact, tongue/uvula midline, all extremities muscle strength 5/5, sensation intact throughout, negative dysarthria, negative expressive aphasia, negative receptive aphasia.  .     Data Reviewed: Care during the described time interval was provided by me .  I have reviewed this patient's available data, including medical history, events of note, physical examination, and all test results as part of my evaluation.  CBC: Recent Labs  Lab 08/30/19 0617 08/30/19 0617 08/30/19 1459 08/31/19 0141 08/31/19 1201 08/31/19 1912 09/01/19 0609 09/01/19 1230 09/01/19 1816 09/02/19 0027 09/02/19 0639 09/02/19 1155 09/02/19 1821  WBC 5.2  --  5.4  --  5.9  --  5.1  --   --   --  5.3  --   --   NEUTROABS 3.7  --   --   --   --   --  3.7  --   --   --  3.9  --   --   HGB 15.1   < > 13.4   < > 10.9*   < > 9.6*   < > 10.6* 9.8* 10.0* 10.4* 10.4*  HCT 45.3   < > 40.7   < > 33.2*   < > 29.5*   < > 31.1* 29.2* 30.0* 30.9* 31.3*  MCV 96.2  --  96.9  --  96.2  --  97.7  --   --   --  94.9  --   --   PLT 162  --  148*  --  144*  --  130*  --   --   --  141*  --   --    < > = values in this interval not displayed.   Basic Metabolic Panel: Recent Labs  Lab 08/30/19 0617 09/01/19 0609 09/02/19 0639  NA 138 141 139  K 3.2* 3.3* 3.1*  CL 99 108 104  CO2 27 26 26   GLUCOSE 121* 115* 108*  BUN 15 12 8   CREATININE 0.92 0.94 0.93  CALCIUM 8.9 7.9* 8.1*  MG  --  1.4* 1.7  PHOS  --  2.9 2.4*   GFR: Estimated Creatinine Clearance: 66.1 mL/min (by C-G formula based on SCr of 0.93 mg/dL). Liver Function Tests: Recent Labs  Lab 08/30/19 0617 09/01/19 0609 09/02/19 0639  AST 21 15 18   ALT 21 15 17   ALKPHOS 53 36* 38  BILITOT 0.8 0.8 0.8  PROT 6.3* 4.4* 5.1*  ALBUMIN 4.0 2.8* 3.1*   No results for input(s): LIPASE, AMYLASE in the last 168 hours. No results for input(s): AMMONIA in the last 168 hours. Coagulation  Profile: Recent Labs  Lab 08/30/19 0617  INR 1.0   Cardiac Enzymes: No results for input(s): CKTOTAL, CKMB, CKMBINDEX, TROPONINI in the last 168 hours. BNP (last 3 results) No results for input(s): PROBNP in the last 8760 hours. HbA1C: No results for input(s): HGBA1C in the last 72 hours. CBG: No results for input(s): GLUCAP in the last 168 hours. Lipid Profile: Recent Labs    09/01/19 0144  CHOL 83  HDL 30*  LDLCALC 46  TRIG 37  CHOLHDL 2.8   Thyroid Function Tests: No results for input(s): TSH, T4TOTAL, FREET4, T3FREE, THYROIDAB in the last 72 hours. Anemia Panel: No results for input(s): VITAMINB12, FOLATE, FERRITIN, TIBC, IRON, RETICCTPCT in the last 72 hours. Sepsis Labs: No results for input(s): PROCALCITON, LATICACIDVEN in the last 168 hours.  Recent Results (from the past 240 hour(s))  SARS Coronavirus 2 by RT PCR (hospital order, performed in [facility] hospital lab) Nasopharyngeal Nasopharyngeal Swab     Status: None   Collection Time: 08/30/19  9:37 AM   Specimen: Nasopharyngeal Swab  Result Value Ref Range Status   SARS Coronavirus 2 NEGATIVE NEGATIVE Final    Comment: (NOTE) SARS-CoV-2 target nucleic acids are NOT DETECTED.  The SARS-CoV-2 RNA is generally detectable in upper and lower respiratory specimens during the acute phase of infection. The lowest concentration of SARS-CoV-2 viral copies this assay can detect is 250 copies / mL. A negative result does not preclude SARS-CoV-2 infection and should not be used as the sole basis for treatment or other patient management decisions.  A negative result may occur with improper specimen collection / handling, submission of specimen other than nasopharyngeal swab, presence of viral mutation(s) within the areas targeted by this assay, and inadequate number of viral copies (<250 copies / mL). A negative result must be combined with clinical observations, patient history, and epidemiological  information.  Fact Sheet for Patients:   StrictlyIdeas.no  Fact Sheet for Healthcare Providers: BankingDealers.co.za  This test is not yet approved or  cleared by  the [employer] and has been authorized for detection and/or diagnosis of SARS-CoV-2 by FDA under an Emergency Use Authorization (EUA).  This EUA will remain in effect (meaning this test can be used) for the duration of the COVID-19 declaration under Section 564(b)(1) of the Act, 21 U.S.C. section 360bbb-3(b)(1), unless the authorization is terminated or revoked sooner.  Performed at [facility], [address], [place], [place] [address]          Radiology Studies: MR LIVER W WO CONTRAST  Result Date: 09/02/2019 CLINICAL DATA:  Liver lesion and patient with history of rectal mass. EXAM: MRI ABDOMEN WITHOUT AND WITH CONTRAST TECHNIQUE: Multiplanar multisequence MR imaging of the abdomen was performed both before and after the administration of intravenous contrast. CONTRAST:  32mL GADAVIST GADOBUTROL 1 MMOL/ML IV SOLN COMPARISON:  CT scan 08/30/2019 FINDINGS: Lower chest: Unremarkable Hepatobiliary: Numerous tiny T2 hyperintensity scattered throughout both hepatic lobes are too small to characterize and likely benign. Lesion of concern in the posterior right liver measures 1.9 cm on axial T2 fat suppressed image 12 of series 6. This lesion restricts diffusion and shows peripheral rim enhancement after IV contrast administration. (See arterial phase T1 gradient image 38 of series 14/1). Tiny peripheral radical of the portal vein passes along the inferior aspect of this cystic lesion. Pancreas: No focal mass lesion. No dilatation of the main duct. No intraparenchymal cyst. No peripancreatic edema. Spleen:  No splenomegaly. No focal mass lesion. Adrenals/Urinary Tract: No adrenal nodule or mass. Multiple cysts are identified in both kidneys, left greater than right, measuring up to  6.7 cm in the interpolar left kidney. Stomach/Bowel: Stomach is unremarkable. No gastric wall thickening. No evidence of outlet obstruction. Duodenum is normally positioned as is the ligament of Treitz. No small bowel or colonic dilatation within the visualized abdomen. Vascular/Lymphatic: No abdominal aortic aneurysm There is no gastrohepatic or hepatoduodenal ligament lymphadenopathy. No retroperitoneal or mesenteric lymphadenopathy. Other:  No intraperitoneal free fluid. Musculoskeletal: No suspicious abnormal marrow enhancement within the visualized bony anatomy. IMPRESSION: 1. 1.9 cm rim lesion in the posterior right liver shows a thin rim of peripheral rim enhancement. Central aspect of the lesion is cystic/necrotic with evidence of restricted diffusion. Imaging features are concerning for a metastatic lesion. PET-CT may prove helpful to further evaluate. 2. Numerous tiny T2 hyperintensity scattered throughout both hepatic lobes are too small to characterize and likely benign. 3. Bilateral renal cysts. Electronically Signed   By: Misty Stanley M.D.   On: 09/02/2019 14:48        Scheduled Meds: . allopurinol  100 mg Oral Daily  . doxazosin  8 mg Oral QHS  . finasteride  5 mg Oral Daily  . hydrocortisone  25 mg Rectal BID  . lidocaine   Topical BID  . loratadine  10 mg Oral Daily  . pantoprazole  40 mg Oral Daily  . potassium chloride  40 mEq Oral TID  . simvastatin  40 mg Oral q1800  . triamterene-hydrochlorothiazide  1 tablet Oral Daily   Continuous Infusions:    LOS: 2 days    Time spent:40 min    Katira Dumais, Geraldo Docker, MD [facility] Pager [phone]  If 7PM-7AM, please contact night-coverage [email or web address] Password [facility] 09/02/2019, 8:51 PM

## 2019-09-02 NOTE — Progress Notes (Signed)
[facility] Gastroenterology Progress Note  Nicholas Hines 86 y.o. June 28, 1933  CC: Rectal bleeding   Subjective: Patient seen and examined at bedside.  Started having rectal bleeding again.  Denies any abdominal pain.  Awaiting MRI today.  ROS : Afebrile.  Negative for chest pain.   Objective: Vital signs in last 24 hours: Vitals:   09/02/19 0459 09/02/19 0933  BP: 136/76 120/69  Pulse: 88 92  Resp:  18  Temp: 98.6 F (37 C) 98.3 F (36.8 C)  SpO2: 98% 99%    Physical Exam:  General:  Alert, cooperative, no distress, appears stated age  Head:  Normocephalic, without obvious abnormality, atraumatic  Eyes:  , EOM's intact,   Lungs:    No respiratory distress noted  Heart:  Regular rate and rhythm, S1, S2 normal  Abdomen:   Soft, non-tender, nondistended, bowel sounds present  Extremities: Extremities normal, atraumatic, no  edema       Lab Results: Recent Labs    09/01/19 0609 09/02/19 0639  NA 141 139  K 3.3* 3.1*  CL 108 104  CO2 26 26  GLUCOSE 115* 108*  BUN 12 8  CREATININE 0.94 0.93  CALCIUM 7.9* 8.1*  MG 1.4* 1.7  PHOS 2.9 2.4*   Recent Labs    09/01/19 0609 09/02/19 0639  AST 15 18  ALT 15 17  ALKPHOS 36* 38  BILITOT 0.8 0.8  PROT 4.4* 5.1*  ALBUMIN 2.8* 3.1*   Recent Labs    09/01/19 0609 09/01/19 1230 09/02/19 0027 09/02/19 0639  WBC 5.1  --   --  5.3  NEUTROABS 3.7  --   --  3.9  HGB 9.6*   < > 9.8* 10.0*  HCT 29.5*   < > 29.2* 30.0*  MCV 97.7  --   --  94.9  PLT 130*  --   --  141*   < > = values in this interval not displayed.   No results for input(s): LABPROT, INR in the last 72 hours.    Assessment/Plan: -Rectal bleeding.    Flexible sigmoidoscopy yesterday showed ulcerated rectal mass concerning for malignancy.  Biopsies pending. -Liver lesion.  CT scan yesterday showed 1.1 cm hypoattenuating lesion within the dome of the right lobe of the liver.  Normal LFTs.  Normal INR. -Recommend outpatient MRI  abdomen  Recommendations ------------------------- -Normal CEA - MRI liver today for further evaluation of liver lesion -Anusol suppository twice a day  -Patient and patient's sister are aware of rectal mass and if surgery is needed they prefer to get it done at [facility]. -Monitor H&H. -Hopefully discharge tomorrow   Otis Brace MD, [place] 09/02/2019, 12:11 PM  Contact #  [phone]

## 2019-09-03 DIAGNOSIS — K625 Hemorrhage of anus and rectum: Secondary | ICD-10-CM

## 2019-09-03 LAB — COMPREHENSIVE METABOLIC PANEL
ALT: 17 U/L (ref 0–44)
AST: 17 U/L (ref 15–41)
Albumin: 3 g/dL — ABNORMAL LOW (ref 3.5–5.0)
Alkaline Phosphatase: 33 U/L — ABNORMAL LOW (ref 38–126)
Anion gap: 9 (ref 5–15)
BUN: 12 mg/dL (ref 8–23)
CO2: 23 mmol/L (ref 22–32)
Calcium: 8 mg/dL — ABNORMAL LOW (ref 8.9–10.3)
Chloride: 105 mmol/L (ref 98–111)
Creatinine, Ser: 1.06 mg/dL (ref 0.61–1.24)
GFR calc Af Amer: >60 mL/min (ref >60)
GFR calc non Af Amer: >60 mL/min (ref >60)
Glucose, Bld: 139 mg/dL — ABNORMAL HIGH (ref 70–99)
Potassium: 3.7 mmol/L (ref 3.5–5.1)
Sodium: 137 mmol/L (ref 135–145)
Total Bilirubin: 0.8 mg/dL (ref 0.3–1.2)
Total Protein: 4.7 g/dL — ABNORMAL LOW (ref 6.5–8.1)

## 2019-09-03 LAB — CBC WITH DIFFERENTIAL/PLATELET
Abs Immature Granulocytes: 0.05 10*3/uL (ref 0.00–0.07)
Basophils Absolute: 0 10*3/uL (ref 0.0–0.1)
Basophils Relative: 0 %
Eosinophils Absolute: 0 10*3/uL (ref 0.0–0.5)
Eosinophils Relative: 1 %
HCT: 27.3 % — ABNORMAL LOW (ref 39.0–52.0)
Hemoglobin: 9.1 g/dL — ABNORMAL LOW (ref 13.0–17.0)
Immature Granulocytes: 1 %
Lymphocytes Relative: 8 %
Lymphs Abs: 0.5 10*3/uL — ABNORMAL LOW (ref 0.7–4.0)
MCH: 32 pg (ref 26.0–34.0)
MCHC: 33.3 g/dL (ref 30.0–36.0)
MCV: 96.1 fL (ref 80.0–100.0)
Monocytes Absolute: 0.7 10*3/uL (ref 0.1–1.0)
Monocytes Relative: 10 %
Neutro Abs: 5.5 10*3/uL (ref 1.7–7.7)
Neutrophils Relative %: 80 %
Platelets: 158 10*3/uL (ref 150–400)
RBC: 2.84 MIL/uL — ABNORMAL LOW (ref 4.22–5.81)
RDW: 13.2 % (ref 11.5–15.5)
WBC: 6.8 10*3/uL (ref 4.0–10.5)
nRBC: 0 % (ref 0.0–0.2)

## 2019-09-03 LAB — HEMOGLOBIN AND HEMATOCRIT, BLOOD
HCT: 26.4 % — ABNORMAL LOW (ref 39.0–52.0)
HCT: 27.7 % — ABNORMAL LOW (ref 39.0–52.0)
HCT: 27.9 % — ABNORMAL LOW (ref 39.0–52.0)
Hemoglobin: 8.9 g/dL — ABNORMAL LOW (ref 13.0–17.0)
Hemoglobin: 9.2 g/dL — ABNORMAL LOW (ref 13.0–17.0)
Hemoglobin: 9.2 g/dL — ABNORMAL LOW (ref 13.0–17.0)

## 2019-09-03 LAB — MAGNESIUM: Magnesium: 1.8 mg/dL (ref 1.7–2.4)

## 2019-09-03 LAB — PHOSPHORUS: Phosphorus: 3.1 mg/dL (ref 2.5–4.6)

## 2019-09-03 NOTE — Consult Note (Signed)
Chief Complaint: Patient was seen in consultation today for liver lesion biopsy Chief Complaint  Patient presents with  . Rectal Bleeding   at the request of Dr Alessandra Bevels   Supervising Physician: Sandi Mariscal  Patient Status: [facility] - In-pt  History of Present Illness: Nicholas Hines is a 86 y.o. male   Rectal bleeding GI note: Assessment/Plan: -Rectal bleeding.   Flexible sigmoidoscopy yesterday showed ulcerated rectal mass concerning for malignancy.  -Liver lesion. CT scanshowed 1.1 cm hypoattenuating lesion within the dome of the right lobe of the liver. Normal LFTs. Normal INR.  MRI showed 1.9 cm lesion concerning for metastatic disease. Recommendations -Discussed with Dr. Melina Copa from pathology.  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  Awaiting further stains. -Get IR consult for evaluation of liver biopsy to rule out metastatic carcinoid tumor.  Request for liver lesion bx Reviewed with Dr Vira Blanco procedure  Pt wants bx to be performed in IR at [facility] Scheduled for 7/1  Past Medical History:  Diagnosis Date  . Hyperlipidemia     Past Surgical History:  Procedure Laterality Date  . BIOPSY  08/31/2019   Procedure: BIOPSY;  Surgeon: Otis Brace, MD;  Location: [place];  Service: Gastroenterology;;  . Otho Darner SIGMOIDOSCOPY N/A 08/31/2019   Procedure: FLEXIBLE SIGMOIDOSCOPY;  Surgeon: Otis Brace, MD;  Location: [place];  Service: Gastroenterology;  Laterality: N/A;    Allergies: Sulfa antibiotics  Medications: Prior to Admission medications   Medication Sig Start Date End Date Taking? Authorizing Provider  allopurinol (ZYLOPRIM) 100 MG tablet Take 100 mg by mouth daily.   Yes [provider]  aspirin 81 MG chewable tablet Chew 81 mg by mouth daily.    Yes [provider]  cholecalciferol (VITAMIN D3) 25 MCG (1000 UNIT) tablet Take 1,000 Units by mouth daily.   Yes [provider]  doxazosin (CARDURA) 8 MG tablet Take 8 mg by mouth daily.   Yes [provider]  esomeprazole (NEXIUM) 40 MG capsule Take 40 mg by mouth daily at 12 noon.   Yes [provider]  finasteride (PROSCAR) 5 MG tablet Take 5 mg by mouth daily.   Yes [provider]  Lactobacillus (ACIDOPHILUS PROBIOTIC PO) Take 1 tablet by mouth daily.   Yes [provider]  levocetirizine (XYZAL) 5 MG tablet Take 5 mg by mouth every evening.   Yes [provider]  Multiple Vitamins-Minerals (CENTRUM SILVER 50+MEN PO) Take 1 tablet by mouth daily.    Yes [provider]  NONFORMULARY OR COMPOUNDED ITEM See admin instructions. Diltiazem HCL 2%/Lido HCL 2% ointment - apply to affected area three times daily   Yes [provider]  polyethylene glycol (MIRALAX / GLYCOLAX) 17 g packet Take 17 g by mouth daily as needed for mild constipation or moderate constipation.    Yes [provider]  potassium citrate (UROCIT-K) 10 MEQ (1080 MG) SR tablet Take 20 mEq by mouth in the morning and at bedtime.    Yes [provider]  Simethicone (GAS-X PO) Take 1 tablet by mouth 3 (three) times daily as needed (for bloating).   Yes [provider]  simvastatin (ZOCOR) 40 MG tablet Take 40 mg by mouth daily.   Yes [provider]  triamterene-hydrochlorothiazide (MAXZIDE) 75-50 MG tablet Take 1 tablet by mouth daily.   Yes [provider]  Wheat Dextrin (BENEFIBER PO) Take by mouth See admin instructions. Mix 2 teaspoonfuls with coffee and drink every morning  Yes [provider]  budesonide (ENTOCORT EC) 3 MG 24 hr capsule Take by mouth daily. Patient not taking: Reported on 08/30/2019    [provider]     History reviewed. No pertinent family history.  Social History   Socioeconomic History  . Marital status: Single    Spouse name: Not on file  . Number of children: Not on file  . Years of education:  Not on file  . Highest education level: Not on file  Occupational History  . Not on file  Tobacco Use  . Smoking status: Never Smoker  . Smokeless tobacco: Never Used  Substance and Sexual Activity  . Alcohol use: Never  . Drug use: Never  . Sexual activity: Not on file  Other Topics Concern  . Not on file  Social History Narrative  . Not on file   Social Determinants of Health   Financial Resource Strain:   . Difficulty of Paying Living Expenses:   Food Insecurity:   . Worried About Charity fundraiser in the Last Year:   . Arboriculturist in the Last Year:   Transportation Needs:   . Film/video editor (Medical):   Marland Kitchen Lack of Transportation (Non-Medical):   Physical Activity:   . Days of Exercise per Week:   . Minutes of Exercise per Session:   Stress:   . Feeling of Stress :   Social Connections:   . Frequency of Communication with Friends and Family:   . Frequency of Social Gatherings with Friends and Family:   . Attends Religious Services:   . Active Member of Clubs or Organizations:   . Attends Archivist Meetings:   Marland Kitchen Marital Status:     Review of Systems: A 12 point ROS discussed and pertinent positives are indicated in the HPI above.  All other systems are negative.  Review of Systems  Constitutional: Negative for fatigue and fever.  Respiratory: Negative for cough and shortness of breath.   Cardiovascular: Negative for chest pain.  Gastrointestinal: Positive for anal bleeding. Negative for abdominal pain.  Psychiatric/Behavioral: Negative for confusion and decreased concentration.    Vital Signs: BP 98/63 (BP Location: Left Arm)   Pulse 96   Temp 98.2 F (36.8 C) (Oral)   Resp 18   Ht 6' (1.829 m)   Wt 196 lb 10.4 oz (89.2 kg)   SpO2 98%   BMI 26.67 kg/m   Physical Exam Cardiovascular:     Rate and Rhythm: Normal rate and regular rhythm.     Heart sounds: Normal heart sounds.  Pulmonary:     Effort: Pulmonary effort is normal.       Breath sounds: Normal breath sounds.  Abdominal:     Palpations: Abdomen is soft.  Musculoskeletal:        General: Normal range of motion.  Skin:    General: Skin is warm.  Neurological:     Mental Status: He is alert and oriented to person, place, and time.  Psychiatric:        Behavior: Behavior normal.     Imaging: CT ABDOMEN PELVIS W CONTRAST  Result Date: 08/30/2019 CLINICAL DATA:  Melena.  Rectal pain. EXAM: CT ABDOMEN AND PELVIS WITH CONTRAST TECHNIQUE: Multidetector CT imaging of the abdomen and pelvis was performed using the standard protocol following bolus administration of intravenous contrast. CONTRAST:  178mL OMNIPAQUE IOHEXOL 300 MG/ML  SOLN COMPARISON:  01/30/2018 FINDINGS: Lower chest: Limited visualization of the lower thorax demonstrates  minimal dependent subpleural ground-glass atelectasis, left greater than right. No discrete focal airspace opacities. No pleural effusion. Normal heart size. Coronary artery calcifications. No pericardial effusion. Hepatobiliary: Normal hepatic contour. Interval development of an approximately 1.1 cm hypoattenuating lesion within the dome of the right lobe of the liver, incompletely characterized on present examination. Additional scattered subcentimeter hypoattenuating [place] lesions are too small to accurately characterize though similar to the 2019 examination favored to represent hepatic cysts. Normal appearance of the gallbladder given degree distention. No radiopaque gallstones. No intra or extrahepatic biliary ductal dilatation. No ascites. Pancreas: Normal appearance of the pancreas. Spleen: Normal appearance of the spleen. Adrenals/Urinary Tract: There is symmetric enhancement and excretion of the bilateral kidneys. Redemonstrated left-sided renal cysts with dominant partially exophytic cyst arising from the inferolateral aspect the left kidney measuring 5.3 cm in diameter. No discrete right-sided renal lesions. Note is made of 4  punctate (sub 4 mm) left-sided renal stones (coronal images 68, 70, 77 and 79, series 5 as well as a solitary 3 mm nonobstructing right-sided renal stone (coronal image 59, series 5). Minimal grossly symmetric bilateral perinephric stranding. No evidence of urinary obstruction. Normal appearance the bilateral adrenal glands. There is mass effect of the prostate gland on the undersurface of the urinary bladder. Otherwise, normal appearance of the urinary bladder given degree of distention. Stomach/Bowel: Linear high density debris seen within the rectum (coronal image 95, series 5; axial images 80 through 85, series 2, potentially ingested radiopaque debris though an area of intraluminal contrast extravasation could have a similar appearance. Note, pre contrast and delayed contrast images were not obtained. Scattered colonic diverticulosis without evidence of superimposed acute diverticulitis. Moderate colonic stool burden without evidence of enteric obstruction. Normal appearance of the terminal ileum and the retrocecal appendix. No discrete areas of bowel wall thickening. No pneumoperitoneum, pneumatosis or portal venous gas. Vascular/Lymphatic: Minimal amount of mixed calcified and noncalcified atherosclerotic plaque throughout the normal caliber abdominal aorta, not resulting in a hemodynamically significant stenosis on this non CTA examination. 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 No bulky retroperitoneal, mesenteric, pelvic or inguinal lymphadenopathy. Reproductive: Prostatomegaly, in particular, hypertrophy with mass effect upon the undersurface of the urinary bladder. Other: Small mesenteric fat containing periumbilical hernia. There is a minimal amount of subcutaneous edema about the midline of the low back. Musculoskeletal: Stigmata of dish within the thoracic spine. Mild to moderate multilevel lumbar spine DDD, worse at L5-S1 with disc space height loss, endplate irregularity and  sclerosis. IMPRESSION: 1. Linear radiopaque debris within the rectum, potentially ingested debris, though intraluminal contrast extravasation could have a similar appearance, incompletely evaluated on this non multiphase examination. Clinical correlation is advised. Further evaluation with multiphase GI bleeding protocol CT scan of the abdomen pelvis could be performed as indicated. 2. Colonic diverticulosis without evidence superimposed acute diverticulitis. 3. Small hiatal hernia. 4. Nonobstructing bilateral nephrolithiasis, left greater than right. 5. Coronary calcifications.  Aortic Atherosclerosis (ICD10-I70.0). 6. Interval development of an approximately 1.1 cm hypoattenuating lesion within the dome of the right lobe of the liver, not seen on the 2019 examination though incompletely characterized on the present examination. Further evaluation with nonemergent abdominal MRI could be performed as indicated. Critical Value/emergent results were called by telephone at the time of interpretation on 08/30/2019 at 9:07 am to provider [facility] , who verbally acknowledged these results. Electronically Signed   By: Sandi Mariscal M.D.   On: 08/30/2019 09:12   MR LIVER W WO CONTRAST  Result Date: 09/02/2019  CLINICAL DATA:  Liver lesion and patient with history of rectal mass. EXAM: MRI ABDOMEN WITHOUT AND WITH CONTRAST TECHNIQUE: Multiplanar multisequence MR imaging of the abdomen was performed both before and after the administration of intravenous contrast. CONTRAST:  21mL GADAVIST GADOBUTROL 1 MMOL/ML IV SOLN COMPARISON:  CT scan 08/30/2019 FINDINGS: Lower chest: Unremarkable Hepatobiliary: Numerous tiny T2 hyperintensity scattered throughout both hepatic lobes are too small to characterize and likely benign. Lesion of concern in the posterior right liver measures 1.9 cm on axial T2 fat suppressed image 12 of series 6. This lesion restricts diffusion and shows peripheral rim enhancement after IV contrast  administration. (See arterial phase T1 gradient image 38 of series 14/1). Tiny peripheral radical of the portal vein passes along the inferior aspect of this cystic lesion. Pancreas: No focal mass lesion. No dilatation of the main duct. No intraparenchymal cyst. No peripancreatic edema. Spleen:  No splenomegaly. No focal mass lesion. Adrenals/Urinary Tract: No adrenal nodule or mass. Multiple cysts are identified in both kidneys, left greater than right, measuring up to 6.7 cm in the interpolar left kidney. Stomach/Bowel: Stomach is unremarkable. No gastric wall thickening. No evidence of outlet obstruction. Duodenum is normally positioned as is the ligament of Treitz. No small bowel or colonic dilatation within the visualized abdomen. Vascular/Lymphatic: No abdominal aortic aneurysm There is no gastrohepatic or hepatoduodenal ligament lymphadenopathy. No retroperitoneal or mesenteric lymphadenopathy. Other:  No intraperitoneal free fluid. Musculoskeletal: No suspicious abnormal marrow enhancement within the visualized bony anatomy. IMPRESSION: 1. 1.9 cm rim lesion in the posterior right liver shows a thin rim of peripheral rim enhancement. Central aspect of the lesion is cystic/necrotic with evidence of restricted diffusion. Imaging features are concerning for a metastatic lesion. PET-CT may prove helpful to further evaluate. 2. Numerous tiny T2 hyperintensity scattered throughout both hepatic lobes are too small to characterize and likely benign. 3. Bilateral renal cysts. Electronically Signed   By: Misty Stanley M.D.   On: 09/02/2019 14:48    Labs:  CBC: Recent Labs    08/31/19 1201 08/31/19 1912 09/01/19 0609 09/01/19 1230 09/02/19 0639 09/02/19 0639 09/02/19 1155 09/02/19 1821 09/02/19 2357 09/03/19 0632  WBC 5.9  --  5.1  --  5.3  --   --   --   --  6.8  HGB 10.9*   < > 9.6*   < > 10.0*   < > 10.4* 10.4* 9.2* 9.1*  HCT 33.2*   < > 29.5*   < > 30.0*   < > 30.9* 31.3* 27.9* 27.3*  PLT 144*   --  130*  --  141*  --   --   --   --  158   < > = values in this interval not displayed.    COAGS: Recent Labs    08/30/19 0617  INR 1.0  APTT 26    BMP: Recent Labs    08/30/19 0617 09/01/19 0609 09/02/19 0639 09/03/19 0632  NA 138 141 139 137  K 3.2* 3.3* 3.1* 3.7  CL 99 108 104 105  CO2 27 26 26 23   GLUCOSE 121* 115* 108* 139*  BUN 15 12 8 12   CALCIUM 8.9 7.9* 8.1* 8.0*  CREATININE 0.92 0.94 0.93 1.06  GFRNONAA >60 >60 >60 >60  GFRAA >60 >60 >60 >60    LIVER FUNCTION TESTS: Recent Labs    08/30/19 0617 09/01/19 0609 09/02/19 0639 09/03/19 0632  BILITOT 0.8 0.8 0.8 0.8  AST 21 15 18 17   ALT 21 15  17 17  ALKPHOS 53 36* 38 33*  PROT 6.3* 4.4* 5.1* 4.7*  ALBUMIN 4.0 2.8* 3.1* 3.0*    TUMOR MARKERS: No results for input(s): AFPTM, CEA, CA199, CHROMGRNA in the last 8760 hours.  Assessment and Plan:  Rectal bleeding Rectal bx revealing carcinoid tumor Liver lesion on MRI-- concerning for malignancy Scheduled now for liver lesion biopsy 7/1 in IR Risks and benefits of liver lesion bx was discussed with the patient and/or patient's family including, but not limited to bleeding, infection, damage to adjacent structures or low yield requiring additional tests.  All of the questions were answered and there is agreement to proceed. Consent signed and in chart.   Thank you for this interesting consult.  I greatly enjoyed meeting JEANCLAUDE WENTWORTH and look forward to participating in their care.  A copy of this report was sent to the requesting provider on this date.  Electronically Signed: Lavonia Drafts, PA-C 09/03/2019, 12:12 PM   I spent a total of 40 Minutes    in face to face in clinical consultation, greater than 50% of which was counseling/coordinating care for liver lesion bx

## 2019-09-03 NOTE — Progress Notes (Signed)
[facility] Gastroenterology Progress Note  Nicholas Hines 86 y.o. 1933-07-17  CC: Rectal bleeding   Subjective: Patient seen and examined at bedside.  Had multiple episodes of rectal bleeding yesterday.  Denies further episodes of rectal bleeding this morning.  Complaining of weakness but denies any nausea or vomiting.  ROS : Afebrile.  Negative for chest pain.  Positive for weakness   Objective: Vital signs in last 24 hours: Vitals:   09/03/19 0420 09/03/19 0700  BP: 95/63 98/63  Pulse: 96   Resp: 18   Temp: 98 F (36.7 C) 98.2 F (36.8 C)  SpO2: 98%     Physical Exam:  General:  Alert, cooperative, no distress, appears stated age  Head:  Normocephalic, without obvious abnormality, atraumatic  Eyes:  , EOM's intact,   Lungs:    No respiratory distress noted  Heart:  Regular rate and rhythm, S1, S2 normal  Abdomen:   Soft, non-tender, nondistended, bowel sounds present  Extremities: Extremities normal, atraumatic, no  edema       Lab Results: Recent Labs    09/02/19 0639 09/03/19 0632  NA 139 137  K 3.1* 3.7  CL 104 105  CO2 26 23  GLUCOSE 108* 139*  BUN 8 12  CREATININE 0.93 1.06  CALCIUM 8.1* 8.0*  MG 1.7 1.8  PHOS 2.4* 3.1   Recent Labs    09/02/19 0639 09/03/19 0632  AST 18 17  ALT 17 17  ALKPHOS 38 33*  BILITOT 0.8 0.8  PROT 5.1* 4.7*  ALBUMIN 3.1* 3.0*   Recent Labs    09/02/19 0639 09/02/19 1155 09/02/19 2357 09/03/19 0632  WBC 5.3  --   --  6.8  NEUTROABS 3.9  --   --  5.5  HGB 10.0*   < > 9.2* 9.1*  HCT 30.0*   < > 27.9* 27.3*  MCV 94.9  --   --  96.1  PLT 141*  --   --  158   < > = values in this interval not displayed.   No results for input(s): LABPROT, INR in the last 72 hours.    Assessment/Plan: -Rectal bleeding.    Flexible sigmoidoscopy yesterday showed ulcerated rectal mass concerning for malignancy.  -Liver lesion.  CT scanshowed 1.1 cm hypoattenuating lesion within the dome of the right lobe of the liver.  Normal LFTs.   Normal INR.  MRI showed 1.9 cm lesion concerning for metastatic disease.   Recommendations ------------------------- -Discussed with Dr. Melina Copa from pathology.  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  Awaiting further stains. -Get IR consult for evaluation of liver biopsy to rule out metastatic carcinoid tumor. -Discussed with patient as well as patient's sister.They prefer inpatient transfer to [facility] for further management. -  Patient is feeling weak and has no help at home.  We will get social worker involved for discharge planning. -GI will follow   Otis Brace MD, [place] 09/03/2019, 9:48 AM  Contact #  [phone]

## 2019-09-03 NOTE — Progress Notes (Signed)
Progress Note    Nicholas Hines  YTK:[id number] DOB: 12-Jul-1933  DOA: 08/30/2019 PCP: Patient, No Pcp Per    Brief Narrative:     Medical records reviewed and are as summarized below:  Nicholas Hines is an 86 y.o. male who presented with rectal bleeding and flex sig  showed an ulcerated rectal mass concerning for malignancy.  Patient was also found to have a lesion on the right lobe of his liver and MRI shows that is concerning for metastatic disease.  Preliminary biopsy report shows carcinoid tumor.  Plan per IR is to get a biopsy to rule out metastatic carcinoid tumor.    Assessment/Plan:   Principal Problem:   Rectal bleeding Active Problems:   HLD (hyperlipidemia)   HTN (hypertension)   BPH (benign prostatic hyperplasia)   Gout   Allergic rhinitis   GERD (gastroesophageal reflux disease)   Liver lesion, right lobe   Rectal mass/Rectal bleeding -Preliminary path reports for possible carcinoid -Per patient  discussion with both Dr. Alessandra Bevels and his sister he prefers to follow up at [facility].  I have called patient's sister to confirm and to hopefully make arrangements for outpatient follow-up  RIGHT lobe Liver lesion -CT scan showed new liver lesion which needs further evaluation with MRI.  See results below -MRI liver demonstrates 1.9 cm rim lesion in the posterior right liver concerning for mets  -IR plans to biopsy this area  HTN, -Maxide 75-50 mg 1 tablet daily  Hypokalemia -K-Dur 40 mEq x 2  Hypomagnesmia -Magnesium IV 3 g  PT eval  Family Communication/Anticipated D/C date and plan/Code Status   DVT prophylaxis: scd Code Status: Full Code.  Family Communication: LM for sister Disposition Plan: Status is: Inpatient  Remains inpatient appropriate because:Ongoing diagnostic testing needed not appropriate for outpatient work up   Dispo: The patient is from: Home              Anticipated d/c is to: Home              Anticipated d/c date is:  1-2 days              Patient currently is not medically stable to d/c.         Medical Consultants:    GI  Subjective:   Feels weak and does not think he will be able to go home alone  Objective:    Vitals:   09/03/19 0106 09/03/19 0109 09/03/19 0420 09/03/19 0700  BP: 126/74 126/74 95/63 98/63   Pulse: 98 98 96   Resp: 19 19 18    Temp: 97.6 F (36.4 C) 97.6 F (36.4 C) 98 F (36.7 C) 98.2 F (36.8 C)  TempSrc:    Oral  SpO2: 96% 96% 98%   Weight:   89.2 kg   Height:        Intake/Output Summary (Last 24 hours) at 09/03/2019 1558 Last data filed at 09/03/2019 1300 Gross per 24 hour  Intake 720 ml  Output 0 ml  Net 720 ml   Filed Weights   08/30/19 0612 09/01/19 2038 09/03/19 0420  Weight: 90.7 kg 91.7 kg 89.2 kg    Exam:  General: Appearance:     Well developed, well nourished male in no acute distress     Lungs:     Clear to auscultation bilaterally, respirations unlabored  Heart:    Normal heart rate. Normal rhythm. No murmurs, rubs, or gallops.   MS:   All extremities  are intact.   Neurologic:   Awake, alert, oriented x 3. No apparent focal neurological           defect.     Data Reviewed:   I have personally reviewed following labs and imaging studies:  Labs: Labs show the following:   Basic Metabolic Panel: Recent Labs  Lab 08/30/19 0617 08/30/19 0617 09/01/19 0609 09/01/19 0609 09/02/19 0639 09/03/19 0632  NA 138  --  141  --  139 137  K 3.2*   < > 3.3*   < > 3.1* 3.7  CL 99  --  108  --  104 105  CO2 27  --  26  --  26 23  GLUCOSE 121*  --  115*  --  108* 139*  BUN 15  --  12  --  8 12  CREATININE 0.92  --  0.94  --  0.93 1.06  CALCIUM 8.9  --  7.9*  --  8.1* 8.0*  MG  --   --  1.4*  --  1.7 1.8  PHOS  --   --  2.9  --  2.4* 3.1   < > = values in this interval not displayed.   GFR Estimated Creatinine Clearance: 58 mL/min (by C-G formula based on SCr of 1.06 mg/dL). Liver Function Tests: Recent Labs  Lab 08/30/19 0617  09/01/19 0609 09/02/19 0639 09/03/19 0632  AST 21 15 18 17   ALT 21 15 17 17   ALKPHOS 53 36* 38 33*  BILITOT 0.8 0.8 0.8 0.8  PROT 6.3* 4.4* 5.1* 4.7*  ALBUMIN 4.0 2.8* 3.1* 3.0*   No results for input(s): LIPASE, AMYLASE in the last 168 hours. No results for input(s): AMMONIA in the last 168 hours. Coagulation profile Recent Labs  Lab 08/30/19 0617  INR 1.0    CBC: Recent Labs  Lab 08/30/19 0617 08/30/19 0617 08/30/19 1459 08/31/19 0141 08/31/19 1201 08/31/19 1912 09/01/19 0609 09/01/19 1230 09/02/19 0639 09/02/19 0639 09/02/19 1155 09/02/19 1821 09/02/19 2357 09/03/19 0632 09/03/19 1214  WBC 5.2   < > 5.4  --  5.9  --  5.1  --  5.3  --   --   --   --  6.8  --   NEUTROABS 3.7  --   --   --   --   --  3.7  --  3.9  --   --   --   --  5.5  --   HGB 15.1   < > 13.4   < > 10.9*   < > 9.6*   < > 10.0*   < > 10.4* 10.4* 9.2* 9.1* 9.2*  HCT 45.3   < > 40.7   < > 33.2*   < > 29.5*   < > 30.0*   < > 30.9* 31.3* 27.9* 27.3* 27.7*  MCV 96.2   < > 96.9  --  96.2  --  97.7  --  94.9  --   --   --   --  96.1  --   PLT 162   < > 148*  --  144*  --  130*  --  141*  --   --   --   --  158  --    < > = values in this interval not displayed.   Cardiac Enzymes: No results for input(s): CKTOTAL, CKMB, CKMBINDEX, TROPONINI in the last 168 hours. BNP (last 3 results) No results for input(s): PROBNP in the last  8760 hours. CBG: No results for input(s): GLUCAP in the last 168 hours. D-Dimer: No results for input(s): DDIMER in the last 72 hours. Hgb A1c: No results for input(s): HGBA1C in the last 72 hours. Lipid Profile: Recent Labs    09/01/19 0144  CHOL 83  HDL 30*  LDLCALC 46  TRIG 37  CHOLHDL 2.8   Thyroid function studies: No results for input(s): TSH, T4TOTAL, T3FREE, THYROIDAB in the last 72 hours.  Invalid input(s): FREET3 Anemia work up: No results for input(s): VITAMINB12, FOLATE, FERRITIN, TIBC, IRON, RETICCTPCT in the last 72 hours. Sepsis Labs: Recent Labs   Lab 08/31/19 1201 09/01/19 0609 09/02/19 0639 09/03/19 0632  WBC 5.9 5.1 5.3 6.8    Microbiology Recent Results (from the past 240 hour(s))  SARS Coronavirus 2 by RT PCR (hospital order, performed in [facility] hospital lab) Nasopharyngeal Nasopharyngeal Swab     Status: None   Collection Time: 08/30/19  9:37 AM   Specimen: Nasopharyngeal Swab  Result Value Ref Range Status   SARS Coronavirus 2 NEGATIVE NEGATIVE Final    Comment: (NOTE) SARS-CoV-2 target nucleic acids are NOT DETECTED.  The SARS-CoV-2 RNA is generally detectable in upper and lower respiratory specimens during the acute phase of infection. The lowest concentration of SARS-CoV-2 viral copies this assay can detect is 250 copies / mL. A negative result does not preclude SARS-CoV-2 infection and should not be used as the sole basis for treatment or other patient management decisions.  A negative result may occur with improper specimen collection / handling, submission of specimen other than nasopharyngeal swab, presence of viral mutation(s) within the areas targeted by this assay, and inadequate number of viral copies (<250 copies / mL). A negative result must be combined with clinical observations, patient history, and epidemiological information.  Fact Sheet for Patients:   StrictlyIdeas.no  Fact Sheet for Healthcare Providers: BankingDealers.co.za  This test is not yet approved or  cleared by the [place] FDA and has been authorized for detection and/or diagnosis of SARS-CoV-2 by FDA under an Emergency Use Authorization (EUA).  This EUA will remain in effect (meaning this test can be used) for the duration of the COVID-19 declaration under Section 564(b)(1) of the Act, 21 U.S.C. section 360bbb-3(b)(1), unless the authorization is terminated or revoked sooner.  Performed at [facility], [address] [place], [place] [address]     Procedures and  diagnostic studies:  MR LIVER W WO CONTRAST  Result Date: 09/02/2019 CLINICAL DATA:  Liver lesion and patient with history of rectal mass. EXAM: MRI ABDOMEN WITHOUT AND WITH CONTRAST TECHNIQUE: Multiplanar multisequence MR imaging of the abdomen was performed both before and after the administration of intravenous contrast. CONTRAST:  35mL GADAVIST GADOBUTROL 1 MMOL/ML IV SOLN COMPARISON:  CT scan 08/30/2019 FINDINGS: Lower chest: Unremarkable Hepatobiliary: Numerous tiny T2 hyperintensity scattered throughout both hepatic lobes are too small to characterize and likely benign. Lesion of concern in the posterior right liver measures 1.9 cm on axial T2 fat suppressed image 12 of series 6. This lesion restricts diffusion and shows peripheral rim enhancement after IV contrast administration. (See arterial phase T1 gradient image 38 of series 14/1). Tiny peripheral radical of the portal vein passes along the inferior aspect of this cystic lesion. Pancreas: No focal mass lesion. No dilatation of the main duct. No intraparenchymal cyst. No peripancreatic edema. Spleen:  No splenomegaly. No focal mass lesion. Adrenals/Urinary Tract: No adrenal nodule or mass. Multiple cysts are identified in both kidneys, left greater  than right, measuring up to 6.7 cm in the interpolar left kidney. Stomach/Bowel: Stomach is unremarkable. No gastric wall thickening. No evidence of outlet obstruction. Duodenum is normally positioned as is the ligament of Treitz. No small bowel or colonic dilatation within the visualized abdomen. Vascular/Lymphatic: No abdominal aortic aneurysm There is no gastrohepatic or hepatoduodenal ligament lymphadenopathy. No retroperitoneal or mesenteric lymphadenopathy. Other:  No intraperitoneal free fluid. Musculoskeletal: No suspicious abnormal marrow enhancement within the visualized bony anatomy. IMPRESSION: 1. 1.9 cm rim lesion in the posterior right liver shows a thin rim of peripheral rim enhancement.  Central aspect of the lesion is cystic/necrotic with evidence of restricted diffusion. Imaging features are concerning for a metastatic lesion. PET-CT may prove helpful to further evaluate. 2. Numerous tiny T2 hyperintensity scattered throughout both hepatic lobes are too small to characterize and likely benign. 3. Bilateral renal cysts. Electronically Signed   By: Misty Stanley M.D.   On: 09/02/2019 14:48    Medications:   . allopurinol  100 mg Oral Daily  . doxazosin  8 mg Oral QHS  . finasteride  5 mg Oral Daily  . hydrocortisone  25 mg Rectal BID  . lidocaine   Topical BID  . loratadine  10 mg Oral Daily  . pantoprazole  40 mg Oral Daily  . simvastatin  40 mg Oral q1800  . triamterene-hydrochlorothiazide  1 tablet Oral Daily   Continuous Infusions:   LOS: 3 days   Geradine Girt  [facility]   How to contact the [facility] Attending or Consulting provider [place] or covering provider during after hours [place], for this patient?  1. Check the care team in [facility] and look for a) attending/consulting [facility] provider listed and b) the [facility] team listed 2. Log into [email or web address] and use [place] universal password to access. If you do not have the password, please contact the hospital operator. 3. Locate the [facility] provider you are looking for under [facility] and page to a number that you can be directly reached. 4. If you still have difficulty reaching the provider, please page the [facility] (Director on Call) for the Hospitalists listed on amion for assistance.  09/03/2019, 3:58 PM

## 2019-09-04 ENCOUNTER — Inpatient Hospital Stay (HOSPITAL_COMMUNITY): Payer: Medicare Other

## 2019-09-04 LAB — CBC WITH DIFFERENTIAL/PLATELET
Abs Immature Granulocytes: 0.05 10*3/uL (ref 0.00–0.07)
Basophils Absolute: 0 10*3/uL (ref 0.0–0.1)
Basophils Relative: 1 %
Eosinophils Absolute: 0.1 10*3/uL (ref 0.0–0.5)
Eosinophils Relative: 2 %
HCT: 25.9 % — ABNORMAL LOW (ref 39.0–52.0)
Hemoglobin: 8.8 g/dL — ABNORMAL LOW (ref 13.0–17.0)
Immature Granulocytes: 1 %
Lymphocytes Relative: 10 %
Lymphs Abs: 0.6 10*3/uL — ABNORMAL LOW (ref 0.7–4.0)
MCH: 32.8 pg (ref 26.0–34.0)
MCHC: 34 g/dL (ref 30.0–36.0)
MCV: 96.6 fL (ref 80.0–100.0)
Monocytes Absolute: 0.7 10*3/uL (ref 0.1–1.0)
Monocytes Relative: 12 %
Neutro Abs: 4.7 10*3/uL (ref 1.7–7.7)
Neutrophils Relative %: 74 %
Platelets: 164 10*3/uL (ref 150–400)
RBC: 2.68 MIL/uL — ABNORMAL LOW (ref 4.22–5.81)
RDW: 13.4 % (ref 11.5–15.5)
WBC: 6.1 10*3/uL (ref 4.0–10.5)
nRBC: 0 % (ref 0.0–0.2)

## 2019-09-04 LAB — COMPREHENSIVE METABOLIC PANEL
ALT: 16 U/L (ref 0–44)
AST: 17 U/L (ref 15–41)
Albumin: 3 g/dL — ABNORMAL LOW (ref 3.5–5.0)
Alkaline Phosphatase: 36 U/L — ABNORMAL LOW (ref 38–126)
Anion gap: 7 (ref 5–15)
BUN: 10 mg/dL (ref 8–23)
CO2: 27 mmol/L (ref 22–32)
Calcium: 8.2 mg/dL — ABNORMAL LOW (ref 8.9–10.3)
Chloride: 103 mmol/L (ref 98–111)
Creatinine, Ser: 0.94 mg/dL (ref 0.61–1.24)
GFR calc Af Amer: >60 mL/min (ref >60)
GFR calc non Af Amer: >60 mL/min (ref >60)
Glucose, Bld: 122 mg/dL — ABNORMAL HIGH (ref 70–99)
Potassium: 3 mmol/L — ABNORMAL LOW (ref 3.5–5.1)
Sodium: 137 mmol/L (ref 135–145)
Total Bilirubin: 0.5 mg/dL (ref 0.3–1.2)
Total Protein: 4.8 g/dL — ABNORMAL LOW (ref 6.5–8.1)

## 2019-09-04 LAB — HEMOGLOBIN AND HEMATOCRIT, BLOOD
HCT: 25.7 % — ABNORMAL LOW (ref 39.0–52.0)
HCT: 25.7 % — ABNORMAL LOW (ref 39.0–52.0)
Hemoglobin: 8.4 g/dL — ABNORMAL LOW (ref 13.0–17.0)
Hemoglobin: 8.5 g/dL — ABNORMAL LOW (ref 13.0–17.0)

## 2019-09-04 LAB — PHOSPHORUS: Phosphorus: 3.2 mg/dL (ref 2.5–4.6)

## 2019-09-04 LAB — MAGNESIUM: Magnesium: 1.7 mg/dL (ref 1.7–2.4)

## 2019-09-04 MED ORDER — MAGNESIUM SULFATE 2 GM/50ML IV SOLN
2.0000 g | Freq: Once | INTRAVENOUS | Status: AC
  Administered 2019-09-04: 2 g via INTRAVENOUS
  Filled 2019-09-04: qty 50

## 2019-09-04 MED ORDER — MIDAZOLAM HCL 2 MG/2ML IJ SOLN
INTRAMUSCULAR | Status: AC
  Filled 2019-09-04: qty 2

## 2019-09-04 MED ORDER — SODIUM CHLORIDE 0.9 % IV SOLN
510.0000 mg | Freq: Once | INTRAVENOUS | Status: AC
  Administered 2019-09-04: 510 mg via INTRAVENOUS
  Filled 2019-09-04 (×2): qty 17

## 2019-09-04 MED ORDER — FENTANYL CITRATE (PF) 100 MCG/2ML IJ SOLN
INTRAMUSCULAR | Status: AC
  Filled 2019-09-04: qty 2

## 2019-09-04 MED ORDER — MIDAZOLAM HCL 2 MG/2ML IJ SOLN
INTRAMUSCULAR | Status: AC | PRN
  Administered 2019-09-04: 1 mg via INTRAVENOUS

## 2019-09-04 MED ORDER — FENTANYL CITRATE (PF) 100 MCG/2ML IJ SOLN
INTRAMUSCULAR | Status: AC | PRN
  Administered 2019-09-04: 25 ug via INTRAVENOUS

## 2019-09-04 MED ORDER — GELATIN ABSORBABLE 12-7 MM EX MISC
CUTANEOUS | Status: AC
  Filled 2019-09-04: qty 1

## 2019-09-04 MED ORDER — POTASSIUM CHLORIDE CRYS ER 20 MEQ PO TBCR
40.0000 meq | EXTENDED_RELEASE_TABLET | Freq: Once | ORAL | Status: AC
  Administered 2019-09-04: 40 meq via ORAL
  Filled 2019-09-04: qty 2

## 2019-09-04 MED ORDER — LIDOCAINE HCL (PF) 1 % IJ SOLN
INTRAMUSCULAR | Status: AC
  Filled 2019-09-04: qty 30

## 2019-09-04 NOTE — Progress Notes (Addendum)
Progress Note    Nicholas Hines  IHK:[id number] DOB: 02/27/1934  DOA: 08/30/2019 PCP: Patient, No Pcp Per    Brief Narrative:     Medical records reviewed and are as summarized below:  Nicholas Hines is an 86 y.o. male who presented with rectal bleeding and flex sig  showed an ulcerated rectal mass concerning for malignancy.  Patient was also found to have a lesion on the right lobe of his liver and MRI shows that is concerning for metastatic disease.  Preliminary biopsy report shows carcinoid tumor.  Plan per IR is to get a biopsy to rule out metastatic carcinoid tumor.  He will then follow up outpatient with Dr. Morton Stall at [facility].   Assessment/Plan:   Principal Problem:   Rectal bleeding Active Problems:   HLD (hyperlipidemia)   HTN (hypertension)   BPH (benign prostatic hyperplasia)   Gout   Allergic rhinitis   GERD (gastroesophageal reflux disease)   Liver lesion, right lobe   Rectal mass/Rectal bleeding -path report: small cell carcinoma  -Per patient  discussion with both Dr. Alessandra Bevels and his sister he prefers to follow up at [facility].  -patient's sister Violeta Gelinas) has spoken with Dr. Lenise Arena who is a surgical oncologist who will be seeing patient as an outpatient -Discussed with Dr. Ellene Route today regarding anticipated continued intermittent rectal bleeding until surgery -IV iron x1 -Patient also need oral iron replacement Patient will also need Anusol suppositories for 2 to 4 weeks  RIGHT lobe Liver lesion -CT scan showed new liver lesion which needs further evaluation with MRI.  See results below -MRI liver demonstrates 1.9 cm rim lesion in the posterior right liver concerning for mets  -Status post biopsy on 7/1  HTN, -DC meds   Hypokalemia -Replete  Hypomagnesmia -Replete  PT eval  Family Communication/Anticipated D/C date and plan/Code Status   DVT prophylaxis: scd Code Status: Full Code.  Family Communication: LM for  sister Disposition Plan: Status is: Inpatient  Remains inpatient appropriate because:Ongoing diagnostic testing needed not appropriate for outpatient work up   Dispo: The patient is from: Home              Anticipated d/c is to: Home              Anticipated d/c date is: home in AM?              Patient currently is not medically stable to d/c.         Medical Consultants:    GI  Subjective:   No further bleeding noted  Objective:    Vitals:   09/04/19 0845 09/04/19 0850 09/04/19 0905 09/04/19 0924  BP: 108/61 102/63 114/63 130/71  Pulse: 75 77 85 91  Resp: 11 18 13 18   Temp:      TempSrc:      SpO2: 97% 97% 96% 93%  Weight:      Height:        Intake/Output Summary (Last 24 hours) at 09/04/2019 1408 Last data filed at 09/04/2019 1300 Gross per 24 hour  Intake 860 ml  Output 2 ml  Net 858 ml   Filed Weights   09/01/19 2038 09/03/19 0420 09/04/19 0503  Weight: 91.7 kg 89.2 kg 90.2 kg    Exam:   General: Appearance:     Overweight male in no acute distress     Lungs:     Clear to auscultation bilaterally, respirations unlabored  Heart:  Normal heart rate. Normal rhythm. No murmurs, rubs, or gallops.   MS:   All extremities are intact.   Neurologic:   Awake, alert, oriented x 3. No apparent focal neurological           defect.      Data Reviewed:   I have personally reviewed following labs and imaging studies:  Labs: Labs show the following:   Basic Metabolic Panel: Recent Labs  Lab 08/30/19 0617 08/30/19 0617 09/01/19 0609 09/01/19 0609 09/02/19 0639 09/02/19 0639 09/03/19 0632 09/04/19 0604  NA 138  --  141  --  139  --  137 137  K 3.2*   < > 3.3*   < > 3.1*   < > 3.7 3.0*  CL 99  --  108  --  104  --  105 103  CO2 27  --  26  --  26  --  23 27  GLUCOSE 121*  --  115*  --  108*  --  139* 122*  BUN 15  --  12  --  8  --  12 10  CREATININE 0.92  --  0.94  --  0.93  --  1.06 0.94  CALCIUM 8.9  --  7.9*  --  8.1*  --  8.0* 8.2*  MG   --   --  1.4*  --  1.7  --  1.8 1.7  PHOS  --   --  2.9  --  2.4*  --  3.1 3.2   < > = values in this interval not displayed.   GFR Estimated Creatinine Clearance: 65.4 mL/min (by C-G formula based on SCr of 0.94 mg/dL). Liver Function Tests: Recent Labs  Lab 08/30/19 0617 09/01/19 0609 09/02/19 0639 09/03/19 0632 09/04/19 0604  AST 21 15 18 17 17   ALT 21 15 17 17 16   ALKPHOS 53 36* 38 33* 36*  BILITOT 0.8 0.8 0.8 0.8 0.5  PROT 6.3* 4.4* 5.1* 4.7* 4.8*  ALBUMIN 4.0 2.8* 3.1* 3.0* 3.0*   No results for input(s): LIPASE, AMYLASE in the last 168 hours. No results for input(s): AMMONIA in the last 168 hours. Coagulation profile Recent Labs  Lab 08/30/19 0617  INR 1.0    CBC: Recent Labs  Lab 08/30/19 0617 08/30/19 1459 08/31/19 1201 08/31/19 1912 09/01/19 0609 09/01/19 1230 09/02/19 0639 09/02/19 1155 09/03/19 0632 09/03/19 1214 09/03/19 1829 09/04/19 0005 09/04/19 0604  WBC 5.2   < > 5.9  --  5.1  --  5.3  --  6.8  --   --   --  6.1  NEUTROABS 3.7  --   --   --  3.7  --  3.9  --  5.5  --   --   --  4.7  HGB 15.1   < > 10.9*   < > 9.6*   < > 10.0*   < > 9.1* 9.2* 8.9* 8.4* 8.8*  HCT 45.3   < > 33.2*   < > 29.5*   < > 30.0*   < > 27.3* 27.7* 26.4* 25.7* 25.9*  MCV 96.2   < > 96.2  --  97.7  --  94.9  --  96.1  --   --   --  96.6  PLT 162   < > 144*  --  130*  --  141*  --  158  --   --   --  164   < > = values in this interval  not displayed.   Cardiac Enzymes: No results for input(s): CKTOTAL, CKMB, CKMBINDEX, TROPONINI in the last 168 hours. BNP (last 3 results) No results for input(s): PROBNP in the last 8760 hours. CBG: No results for input(s): GLUCAP in the last 168 hours. D-Dimer: No results for input(s): DDIMER in the last 72 hours. Hgb A1c: No results for input(s): HGBA1C in the last 72 hours. Lipid Profile: No results for input(s): CHOL, HDL, LDLCALC, TRIG, CHOLHDL, LDLDIRECT in the last 72 hours. Thyroid function studies: No results for input(s):  TSH, T4TOTAL, T3FREE, THYROIDAB in the last 72 hours.  Invalid input(s): FREET3 Anemia work up: No results for input(s): VITAMINB12, FOLATE, FERRITIN, TIBC, IRON, RETICCTPCT in the last 72 hours. Sepsis Labs: Recent Labs  Lab 09/01/19 0609 09/02/19 0639 09/03/19 0632 09/04/19 0604  WBC 5.1 5.3 6.8 6.1    Microbiology Recent Results (from the past 240 hour(s))  SARS Coronavirus 2 by RT PCR (hospital order, performed in [facility] hospital lab) Nasopharyngeal Nasopharyngeal Swab     Status: None   Collection Time: 08/30/19  9:37 AM   Specimen: Nasopharyngeal Swab  Result Value Ref Range Status   SARS Coronavirus 2 NEGATIVE NEGATIVE Final    Comment: (NOTE) SARS-CoV-2 target nucleic acids are NOT DETECTED.  The SARS-CoV-2 RNA is generally detectable in upper and lower respiratory specimens during the acute phase of infection. The lowest concentration of SARS-CoV-2 viral copies this assay can detect is 250 copies / mL. A negative result does not preclude SARS-CoV-2 infection and should not be used as the sole basis for treatment or other patient management decisions.  A negative result may occur with improper specimen collection / handling, submission of specimen other than nasopharyngeal swab, presence of viral mutation(s) within the areas targeted by this assay, and inadequate number of viral copies (<250 copies / mL). A negative result must be combined with clinical observations, patient history, and epidemiological information.  Fact Sheet for Patients:   StrictlyIdeas.no  Fact Sheet for Healthcare Providers: BankingDealers.co.za  This test is not yet approved or  cleared by the [place] FDA and has been authorized for detection and/or diagnosis of SARS-CoV-2 by FDA under an Emergency Use Authorization (EUA).  This EUA will remain in effect (meaning this test can be used) for the duration of the COVID-19 declaration  under Section 564(b)(1) of the Act, 21 U.S.C. section 360bbb-3(b)(1), unless the authorization is terminated or revoked sooner.  Performed at [facility], [address], [place], [place] [address]     Procedures and diagnostic studies:  MR LIVER W WO CONTRAST  Result Date: 09/02/2019 CLINICAL DATA:  Liver lesion and patient with history of rectal mass. EXAM: MRI ABDOMEN WITHOUT AND WITH CONTRAST TECHNIQUE: Multiplanar multisequence MR imaging of the abdomen was performed both before and after the administration of intravenous contrast. CONTRAST:  75mL GADAVIST GADOBUTROL 1 MMOL/ML IV SOLN COMPARISON:  CT scan 08/30/2019 FINDINGS: Lower chest: Unremarkable Hepatobiliary: Numerous tiny T2 hyperintensity scattered throughout both hepatic lobes are too small to characterize and likely benign. Lesion of concern in the posterior right liver measures 1.9 cm on axial T2 fat suppressed image 12 of series 6. This lesion restricts diffusion and shows peripheral rim enhancement after IV contrast administration. (See arterial phase T1 gradient image 38 of series 14/1). Tiny peripheral radical of the portal vein passes along the inferior aspect of this cystic lesion. Pancreas: No focal mass lesion. No dilatation of the main duct. No intraparenchymal cyst. No peripancreatic edema. Spleen:  No splenomegaly. No focal mass lesion. Adrenals/Urinary Tract: No adrenal nodule or mass. Multiple cysts are identified in both kidneys, left greater than right, measuring up to 6.7 cm in the interpolar left kidney. Stomach/Bowel: Stomach is unremarkable. No gastric wall thickening. No evidence of outlet obstruction. Duodenum is normally positioned as is the ligament of Treitz. No small bowel or colonic dilatation within the visualized abdomen. Vascular/Lymphatic: No abdominal aortic aneurysm There is no gastrohepatic or hepatoduodenal ligament lymphadenopathy. No retroperitoneal or mesenteric lymphadenopathy. Other:  No  intraperitoneal free fluid. Musculoskeletal: No suspicious abnormal marrow enhancement within the visualized bony anatomy. IMPRESSION: 1. 1.9 cm rim lesion in the posterior right liver shows a thin rim of peripheral rim enhancement. Central aspect of the lesion is cystic/necrotic with evidence of restricted diffusion. Imaging features are concerning for a metastatic lesion. PET-CT may prove helpful to further evaluate. 2. Numerous tiny T2 hyperintensity scattered throughout both hepatic lobes are too small to characterize and likely benign. 3. Bilateral renal cysts. Electronically Signed   By: Misty Stanley M.D.   On: 09/02/2019 14:48   US BIOPSY (LIVER)  Result Date: 09/04/2019 INDICATION: 86 year old with rectal bleeding and recently discovered rectal lesion. Patient has a new lesion in the right hepatic lobe and concern for metastatic disease. Tissue diagnosis is needed. EXAM: ULTRASOUND-GUIDED LIVER LESION BIOPSY MEDICATIONS: None. ANESTHESIA/SEDATION: Moderate (conscious) sedation was employed during this procedure. A total of Versed 1.0 mg and Fentanyl 25 mcg was administered intravenously. Moderate Sedation Time: 16 minutes. The patient's level of consciousness and vital signs were monitored continuously by radiology nursing throughout the procedure under my direct supervision. FLUOROSCOPY TIME:  None COMPLICATIONS: None immediate. PROCEDURE: Informed written consent was obtained from the patient after a thorough discussion of the procedural risks, benefits and alternatives. All questions were addressed. A timeout was performed prior to the initiation of the procedure. Liver was evaluated with ultrasound. Subtle lesion at the right hepatic dome was identified that corresponds with the lesion on the recent MRI and CT examinations. Right side of the abdomen was prepped with chlorhexidine and sterile field was created. Skin and soft tissues were anesthetized using 1% lidocaine. Using ultrasound guidance, 17  gauge coaxial needle was directed into the right hepatic lobe and the tip was placed adjacent to the hepatic dome lesion. Total of 4 core biopsies were obtained with an 18 gauge core device. Specimens placed in formalin. 17 gauge needle was removed without complication. Bandage placed over the puncture site. FINDINGS: Subtle 1.8 cm lesion at the right hepatic dome corresponds with the recent cross-sectional imaging. Needle position confirmed within the lesion. No immediate bleeding or hematoma formation. IMPRESSION: Ultrasound-guided core biopsy of the lesion at the right hepatic dome. Electronically Signed   By: Markus Daft M.D.   On: 09/04/2019 09:32    Medications:   . allopurinol  100 mg Oral Daily  . doxazosin  8 mg Oral QHS  . fentaNYL      . finasteride  5 mg Oral Daily  . gelatin adsorbable      . hydrocortisone  25 mg Rectal BID  . lidocaine (PF)      . lidocaine   Topical BID  . loratadine  10 mg Oral Daily  . midazolam      . pantoprazole  40 mg Oral Daily  . simvastatin  40 mg Oral q1800   Continuous Infusions: . ferumoxytol       LOS: 4 days   Geradine Girt  [facility]  How to contact the [facility] Attending or Consulting provider [place] or covering provider during after hours [place], for this patient?  1. Check the care team in [facility] and look for a) attending/consulting [facility] provider listed and b) the [facility] team listed 2. Log into [email or web address] and use [place]'s universal password to access. If you do not have the password, please contact the hospital operator. 3. Locate the [facility] provider you are looking for under [facility] and page to a number that you can be directly reached. 4. If you still have difficulty reaching the provider, please page the [facility] (Director on Call) for the Hospitalists listed on amion for assistance.  09/04/2019, 2:08 PM

## 2019-09-04 NOTE — Plan of Care (Signed)
  Problem: Education: Goal: Ability to identify signs and symptoms of gastrointestinal bleeding will improve Outcome: Progressing   Problem: Education: Goal: Knowledge of General Education information will improve Description: Including pain rating scale, medication(s)/side effects and non-pharmacologic comfort measures Outcome: Progressing

## 2019-09-04 NOTE — Progress Notes (Signed)
[facility] Gastroenterology Progress Note  Nicholas Hines 86 y.o. [birth year]  CC: Rectal bleeding   Subjective: Patient seen and examined at bedside.  Denies further rectal bleeding.  Had liver biopsy this morning.  Denies abdominal pain, nausea and vomiting.  ROS : Afebrile.  Negative for chest pain.  Positive for weakness   Objective: Vital signs in last 24 hours: Vitals:   09/04/19 0905 09/04/19 0924  BP: 114/63 130/71  Pulse: 85 91  Resp: 13 18  Temp:    SpO2: 96% 93%    Physical Exam:  General:  Alert, cooperative, no distress, appears stated age  Head:  Normocephalic, without obvious abnormality, atraumatic  Eyes:  , EOM's intact,   Lungs:    No respiratory distress noted  Heart:  Regular rate and rhythm, S1, S2 normal  Abdomen:   Soft, non-tender, nondistended, bowel sounds present  Extremities: Extremities normal, atraumatic, no  edema       Lab Results: Recent Labs    09/03/19 0632 09/04/19 0604  NA 137 137  K 3.7 3.0*  CL 105 103  CO2 23 27  GLUCOSE 139* 122*  BUN 12 10  CREATININE 1.06 0.94  CALCIUM 8.0* 8.2*  MG 1.8 1.7  PHOS 3.1 3.2   Recent Labs    09/03/19 0632 09/04/19 0604  AST 17 17  ALT 17 16  ALKPHOS 33* 36*  BILITOT 0.8 0.5  PROT 4.7* 4.8*  ALBUMIN 3.0* 3.0*   Recent Labs    09/03/19 0632 09/03/19 1214 09/04/19 0005 09/04/19 0604  WBC 6.8  --   --  6.1  NEUTROABS 5.5  --   --  4.7  HGB 9.1*   < > 8.4* 8.8*  HCT 27.3*   < > 25.7* 25.9*  MCV 96.1  --   --  96.6  PLT 158  --   --  164   < > = values in this interval not displayed.   No results for input(s): LABPROT, INR in the last 72 hours.    Assessment/Plan: -Rectal bleeding.    Flexible sigmoidoscopy yesterday showed ulcerated rectal mass -preliminary findings concerning for carcinoid tumor per Dr. Melina Copa. -Liver lesion.  CT scanshowed 1.1 cm hypoattenuating lesion within the dome of the right lobe of the liver.  Normal LFTs.  Normal INR.  MRI showed 1.9 cm lesion  concerning for metastatic disease. -Blood loss anemia.  Hemoglobin stable compared to yesterday   Recommendations ------------------------- -Had liver biopsy today.   -Discussed with Dr. Eliseo Squires.   IV iron supplement to help out with his anemia. -Continue Anusol suppositories for next 2 to 4 weeks -No further inpatient GI work-up planned.  GI will sign off.  Call us back if needed. -Patient would like to have further work-up done at [facility], which can be done as an outpatient.   Otis Brace MD, [place] 09/04/2019, 10:03 AM  Contact #  [phone]

## 2019-09-04 NOTE — Evaluation (Signed)
Physical Therapy Evaluation Patient Details Name: Nicholas Hines MRN: [id number] DOB: 05-08-1933 Today's Date: 09/04/2019   History of Present Illness  Nicholas Hines is an 86 y.o. male who presented with rectal bleeding and flex sig  showed an ulcerated rectal mass concerning for malignancy.  Patient was also found to have a lesion on the right lobe of his liver and MRI shows that is concerning for metastatic disease.  Preliminary biopsy report shows carcinoid tumor.  Plan per IR is to get a biopsy to rule out metastatic carcinoid tumor.  He will then follow up outpatient with Dr. Morton Stall at [facility].   Clinical Impression  Pt presented sitting up in chair with RN present, awake and willing to participate in therapy session. Prior to admission, pt reported that he was independent with all functional mobility and ADLs. He lives alone in a single level home but plans to have his sisters available to assist upon d/c as needed. At the time of evaluation, pt overall at a mod I to supervision level with functional mobility including ambulation without the use of an AD. However, pt a bit limited than his baseline as he states he feels weak and tired. Pt would continue to benefit from skilled physical therapy services at this time while admitted and after d/c to address the below listed limitations in order to improve overall safety and independence with functional mobility.  Of note, pt's BP soft throughout and pt with some reported lightheadedness.  BP readings: 95/72 sitting  99/65 standing  77/52 after walking  99/64 sitting ~2 mins   SpO2 >95%  HR fluctuating between 120-137      Follow Up Recommendations Home health PT    Equipment Recommendations  None recommended by PT    Recommendations for Other Services       Precautions / Restrictions Precautions Precautions: None Restrictions Weight Bearing Restrictions: No      Mobility  Bed Mobility               General bed mobility  comments: pt OOB sitting in chair upon arrival  Transfers Overall transfer level: Modified independent Equipment used: None                Ambulation/Gait Ambulation/Gait assistance: Supervision Gait Distance (Feet): 50 Feet Assistive device: None Gait Pattern/deviations: Step-through pattern;Decreased stride length Gait velocity: WFL   General Gait Details: pt ambulating back and forth in his room, navigating around obstacles without difficulties; pt requesting to stay in the room secondary to mild dizziness and frequent need to use the bathroom  Stairs            Wheelchair Mobility    Modified Rankin (Stroke Patients Only)       Balance Overall balance assessment: Needs assistance Sitting-balance support: Feet supported Sitting balance-Leahy Scale: Good     Standing balance support: No upper extremity supported;During functional activity Standing balance-Leahy Scale: Fair                               Pertinent Vitals/Pain Pain Assessment: No/denies pain    Home Living Family/patient expects to be discharged to:: Private residence Living Arrangements: Alone Available Help at Discharge: Family;Friend(s);Available PRN/intermittently Type of Home: House Home Access: Stairs to enter Entrance Stairs-Rails: Left Entrance Stairs-Number of Steps: 2 Home Layout: One level Home Equipment: None      Prior Function Level of Independence: Independent  Comments: drives     Hand Dominance        Extremity/Trunk Assessment   Upper Extremity Assessment Upper Extremity Assessment: Defer to OT evaluation;Overall [facility] for tasks assessed    Lower Extremity Assessment Lower Extremity Assessment: Overall WFL for tasks assessed    Cervical / Trunk Assessment Cervical / Trunk Assessment: Normal  Communication   Communication: No difficulties  Cognition Arousal/Alertness: Awake/alert Behavior During Therapy: WFL for tasks  assessed/performed Overall Cognitive Status: Within Functional Limits for tasks assessed                                        General Comments      Exercises     Assessment/Plan    PT Assessment Patient needs continued PT services  PT Problem List Decreased strength;Decreased range of motion;Decreased activity tolerance;Decreased balance;Decreased mobility;Decreased coordination;Decreased knowledge of use of DME;Decreased safety awareness;Decreased knowledge of precautions       PT Treatment Interventions DME instruction;Gait training;Stair training;Functional mobility training;Therapeutic activities;Therapeutic exercise;Balance training;Neuromuscular re-education;Patient/family education    PT Goals (Current goals can be found in the Care Plan section)  Acute Rehab PT Goals Patient Stated Goal: to get stronger PT Goal Formulation: With patient Time For Goal Achievement: 09/18/19 Potential to Achieve Goals: Good    Frequency Min 3X/week   Barriers to discharge        Co-evaluation               AM-PAC PT "6 Clicks" Mobility  Outcome Measure Help needed turning from your back to your side while in a flat bed without using bedrails?: None Help needed moving from lying on your back to sitting on the side of a flat bed without using bedrails?: None Help needed moving to and from a bed to a chair (including a wheelchair)?: None Help needed standing up from a chair using your arms (e.g., wheelchair or bedside chair)?: None Help needed to walk in hospital room?: None Help needed climbing 3-5 steps with a railing? : A Little 6 Click Score: 23    End of Session   Activity Tolerance: Patient tolerated treatment well Patient left: in chair;with call bell/phone within reach Nurse Communication: Mobility status PT Visit Diagnosis: Other abnormalities of gait and mobility (R26.89)    Time: 1439-1500 PT Time Calculation (min) (ACUTE ONLY): 21  min   Charges:   PT Evaluation $PT Eval Moderate Complexity: 1 Mod          Eduard Clos, PT, DPT  Acute Rehabilitation Services Pager [phone] Office [place] 09/04/2019, 3:20 PM

## 2019-09-04 NOTE — Progress Notes (Signed)
Occupational Therapy Evaluation Patient Details Name: Nicholas Hines MRN: [id number] DOB: Jul 06, 1933 Today's Date: 09/04/2019    History of Present Illness Nicholas Hines is an 86 y.o. male who presented with rectal bleeding and flex sig  showed an ulcerated rectal mass concerning for malignancy.  Patient was also found to have a lesion on the right lobe of his liver and MRI shows that is concerning for metastatic disease.  Preliminary biopsy report shows carcinoid tumor.  Plan per IR is to get a biopsy to rule out metastatic carcinoid tumor.  He will then follow up outpatient with Dr. Morton Stall at [facility].   Clinical Impression   Patient lives alone in a single level house.  He is independent and active at baseline and has family near him who are able to help as needed.  Today patient demonstrating independence/modified independence with mobility and ADLs.  He needed increased time for some actitivies though no use of AD.  Patient's only complaint was some dizziness.  BP checked and 125/90.  Patient does not need further OT at this time and OT will sign off.  Demonstrated safety to return home with assist from family.    Follow Up Recommendations  No OT follow up    Equipment Recommendations  3 in 1 bedside commode    Recommendations for Other Services       Precautions / Restrictions Precautions Precautions: None Restrictions Weight Bearing Restrictions: No      Mobility Bed Mobility Overal bed mobility: Modified Independent             General bed mobility comments: Use of rails  Transfers Overall transfer level: Modified independent Equipment used: None                  Balance Overall balance assessment: No apparent balance deficits (not formally assessed) Sitting-balance support: Feet supported Sitting balance-Leahy Scale: Good     Standing balance support: No upper extremity supported;During functional activity Standing balance-Leahy Scale: Fair                              ADL either performed or assessed with clinical judgement   ADL Overall ADL's : Independent;Modified independent                                       General ADL Comments: Able to perform ADLs with independence or Mod I for increased time/use of hand to steady self on surface.     Vision         Perception     Praxis      Pertinent Vitals/Pain Pain Assessment: No/denies pain     Hand Dominance     Extremity/Trunk Assessment Upper Extremity Assessment Upper Extremity Assessment: Overall WFL for tasks assessed   Lower Extremity Assessment Lower Extremity Assessment: Defer to PT evaluation   Cervical / Trunk Assessment Cervical / Trunk Assessment: Normal   Communication Communication Communication: No difficulties   Cognition Arousal/Alertness: Awake/alert Behavior During Therapy: WFL for tasks assessed/performed Overall Cognitive Status: Within Functional Limits for tasks assessed                                     General Comments  BP 125/90 after 3 min stand, though complaining of mild  dizziness.    Exercises     Shoulder Instructions      Home Living Family/patient expects to be discharged to:: Private residence Living Arrangements: Alone Available Help at Discharge: Family;Friend(s);Available PRN/intermittently Type of Home: House Home Access: Stairs to enter [employer] of Steps: 2 Entrance Stairs-Rails: Left Home Layout: One level     Bathroom Shower/Tub: Tub/shower unit         Home Equipment: None          Prior Functioning/Environment Level of Independence: Independent        Comments: drives, maintains home, does yard work        OT Problem List: Decreased activity tolerance      OT Treatment/Interventions:      OT Goals(Current goals can be found in the care plan section) Acute Rehab OT Goals Patient Stated Goal: to get stronger OT Goal Formulation:  With patient Time For Goal Achievement: 09/18/19 Potential to Achieve Goals: Good  OT Frequency:     Barriers to D/C:            Co-evaluation              AM-PAC OT "6 Clicks" Daily Activity     Outcome Measure Help from another person eating meals?: None Help from another person taking care of personal grooming?: None Help from another person toileting, which includes using toliet, bedpan, or urinal?: None Help from another person bathing (including washing, rinsing, drying)?: None Help from another person to put on and taking off regular upper body clothing?: None Help from another person to put on and taking off regular lower body clothing?: None 6 Click Score: 24   End of Session Nurse Communication: Mobility status  Activity Tolerance: Patient tolerated treatment well Patient left: in chair;with call bell/phone within reach  OT Visit Diagnosis: Other (comment) (decreased activity tolerance)                Time: 0240-9735 OT Time Calculation (min): 19 min Charges:  OT General Charges $OT Visit: 1 Visit OT Evaluation $OT Eval Moderate Complexity: 1 Mod  August Luz, OTR/L   Phylliss Bob 09/04/2019, 3:39 PM

## 2019-09-04 NOTE — Progress Notes (Signed)
-    Received call from Dr. Melina Copa from pathology. 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 Awaiting liver biopsy results.  Otis Brace MD, [facility] 09/04/2019, 2:36 PM  Contact #  [phone]

## 2019-09-04 NOTE — Procedures (Signed)
Interventional Radiology Procedure:   Indications: Rectal mass and bleeding.  New right hepatic lesion, concern for liver metastasis.  Procedure: US guided liver lesion biopsy  Findings: Subtle lesion at hepatic dome.  Core samples obtained.  Complications: None     EBL: less than 10 ml  Plan: Bedrest 3 hours   Simone Tuckey R. Anselm Pancoast, MD  Pager: [phone]

## 2019-09-05 LAB — CBC WITH DIFFERENTIAL/PLATELET
Abs Immature Granulocytes: 0.06 10*3/uL (ref 0.00–0.07)
Basophils Absolute: 0 10*3/uL (ref 0.0–0.1)
Basophils Relative: 1 %
Eosinophils Absolute: 0.1 10*3/uL (ref 0.0–0.5)
Eosinophils Relative: 2 %
HCT: 24.1 % — ABNORMAL LOW (ref 39.0–52.0)
Hemoglobin: 7.9 g/dL — ABNORMAL LOW (ref 13.0–17.0)
Immature Granulocytes: 1 %
Lymphocytes Relative: 12 %
Lymphs Abs: 0.7 10*3/uL (ref 0.7–4.0)
MCH: 32.1 pg (ref 26.0–34.0)
MCHC: 32.8 g/dL (ref 30.0–36.0)
MCV: 98 fL (ref 80.0–100.0)
Monocytes Absolute: 0.6 10*3/uL (ref 0.1–1.0)
Monocytes Relative: 10 %
Neutro Abs: 4 10*3/uL (ref 1.7–7.7)
Neutrophils Relative %: 74 %
Platelets: 151 10*3/uL (ref 150–400)
RBC: 2.46 MIL/uL — ABNORMAL LOW (ref 4.22–5.81)
RDW: 13.4 % (ref 11.5–15.5)
WBC: 5.4 10*3/uL (ref 4.0–10.5)
nRBC: 0.4 % — ABNORMAL HIGH (ref 0.0–0.2)

## 2019-09-05 LAB — HEMOGLOBIN AND HEMATOCRIT, BLOOD
HCT: 29.8 % — ABNORMAL LOW (ref 39.0–52.0)
Hemoglobin: 10 g/dL — ABNORMAL LOW (ref 13.0–17.0)

## 2019-09-05 LAB — COMPREHENSIVE METABOLIC PANEL
ALT: 20 U/L (ref 0–44)
AST: 19 U/L (ref 15–41)
Albumin: 2.9 g/dL — ABNORMAL LOW (ref 3.5–5.0)
Alkaline Phosphatase: 33 U/L — ABNORMAL LOW (ref 38–126)
Anion gap: 8 (ref 5–15)
BUN: 12 mg/dL (ref 8–23)
CO2: 26 mmol/L (ref 22–32)
Calcium: 8.1 mg/dL — ABNORMAL LOW (ref 8.9–10.3)
Chloride: 104 mmol/L (ref 98–111)
Creatinine, Ser: 0.94 mg/dL (ref 0.61–1.24)
GFR calc Af Amer: >60 mL/min (ref >60)
GFR calc non Af Amer: >60 mL/min (ref >60)
Glucose, Bld: 112 mg/dL — ABNORMAL HIGH (ref 70–99)
Potassium: 3.3 mmol/L — ABNORMAL LOW (ref 3.5–5.1)
Sodium: 138 mmol/L (ref 135–145)
Total Bilirubin: 0.8 mg/dL (ref 0.3–1.2)
Total Protein: 4.6 g/dL — ABNORMAL LOW (ref 6.5–8.1)

## 2019-09-05 LAB — PREPARE RBC (CROSSMATCH)

## 2019-09-05 LAB — SURGICAL PATHOLOGY

## 2019-09-05 LAB — MAGNESIUM: Magnesium: 1.8 mg/dL (ref 1.7–2.4)

## 2019-09-05 MED ORDER — POTASSIUM CHLORIDE CRYS ER 20 MEQ PO TBCR
40.0000 meq | EXTENDED_RELEASE_TABLET | Freq: Once | ORAL | Status: AC
  Administered 2019-09-05: 40 meq via ORAL
  Filled 2019-09-05: qty 2

## 2019-09-05 MED ORDER — SODIUM CHLORIDE 0.9% IV SOLUTION: Freq: Once | INTRAVENOUS | Status: DC

## 2019-09-05 NOTE — Plan of Care (Signed)
  Problem: Education: Goal: Ability to identify signs and symptoms of gastrointestinal bleeding will improve Outcome: Progressing   Problem: Bowel/Gastric: Goal: Will show no signs and symptoms of gastrointestinal bleeding Outcome: Progressing

## 2019-09-05 NOTE — Progress Notes (Signed)
Progress Note    JACOBE STUDY  UYQ:[id number] DOB: 09-Jan-1934  DOA: 08/30/2019 PCP: Celene Squibb, MD    Brief Narrative:     Medical records reviewed and are as summarized below:  EVERARD INTERRANTE is an 86 y.o. male who presented with rectal bleeding and flex sig  showed an ulcerated rectal mass concerning for malignancy.  Patient was also found to have a lesion on the right lobe of his liver and MRI shows that is concerning for metastatic disease.  Preliminary biopsy report shows carcinoid tumor.  Plan per IR is to get a biopsy to rule out metastatic carcinoid tumor.  He will then follow up outpatient with Dr. Morton Stall at [facility].   Assessment/Plan:   Principal Problem:   Rectal bleeding Active Problems:   HLD (hyperlipidemia)   HTN (hypertension)   BPH (benign prostatic hyperplasia)   Gout   Allergic rhinitis   GERD (gastroesophageal reflux disease)   Liver lesion, right lobe   Rectal mass/Rectal bleeding -path report: small cell carcinoma -Per patient  discussion with both Dr. Alessandra Bevels and his sister he prefers to follow up at [facility].  -patient's sister Violeta Gelinas) has spoken with Dr. Lenise Arena who is a surgical oncologist who will be seeing patient as an outpatient -Discussed with Dr. Alessandra Bevels today regarding anticipated continued intermittent rectal bleeding until surgery -IV iron x1 -Patient also need oral iron replacement Patient will also need Anusol suppositories for 2 to 4 weeks  Symptomatic anemia -will get 1 unit PRBC -repeat orthostatics -repeat h/h  RIGHT lobe Liver lesion -CT scan showed new liver lesion which needs further evaluation with MRI.  See results below -MRI liver demonstrates 1.9 cm rim lesion in the posterior right liver concerning for mets  -Status post biopsy on 7/1  HTN, -DC meds  Hypokalemia -Replete  Hypomagnesmia -Replete  PT eval  Family Communication/Anticipated D/C date and plan/Code Status   DVT  prophylaxis: scd Code Status: Full Code.  Family Communication: LM for sister Disposition Plan: Status is: Inpatient  Remains inpatient appropriate because:Ongoing diagnostic testing needed not appropriate for outpatient work up   Dispo: The patient is from: Home              Anticipated d/c is to: Home              Anticipated d/c date is: home once no longer orthostatic              Patient currently is not medically stable to d/c.         Medical Consultants:    GI  Subjective:   No further bleeding overnight  Objective:    Vitals:   09/05/19 0532 09/05/19 0901 09/05/19 0945 09/05/19 1250  BP: 132/78 121/64 129/63 114/73  Pulse: 93 99 86 (!) 104  Resp: 18 18 18 18   Temp: 98.2 F (36.8 C) 99.6 F (37.6 C) 98.4 F (36.9 C) 97.8 F (36.6 C)  TempSrc: Oral Oral Oral Oral  SpO2: 98% 98% 97% 97%  Weight:      Height:        Intake/Output Summary (Last 24 hours) at 09/05/2019 1416 Last data filed at 09/05/2019 1250 Gross per 24 hour  Intake 1455 ml  Output 100 ml  Net 1355 ml   Filed Weights   09/01/19 2038 09/03/19 0420 09/04/19 0503  Weight: 91.7 kg 89.2 kg 90.2 kg    Exam:   General: Appearance:     Overweight  male in no acute distress     Lungs:     Clear to auscultation bilaterally, respirations unlabored  Heart:    Tachycardic. Normal rhythm. No murmurs, rubs, or gallops.   MS:   All extremities are intact.   Neurologic:   Awake, alert, oriented x 3. No apparent focal neurological           defect.       Data Reviewed:   I have personally reviewed following labs and imaging studies:  Labs: Labs show the following:   Basic Metabolic Panel: Recent Labs  Lab 09/01/19 0609 09/01/19 0609 09/02/19 0639 09/02/19 0639 09/03/19 0632 09/03/19 0632 09/04/19 0604 09/05/19 0543  NA 141  --  139  --  137  --  137 138  K 3.3*   < > 3.1*   < > 3.7   < > 3.0* 3.3*  CL 108  --  104  --  105  --  103 104  CO2 26  --  26  --  23  --  27 26    GLUCOSE 115*  --  108*  --  139*  --  122* 112*  BUN 12  --  8  --  12  --  10 12  CREATININE 0.94  --  0.93  --  1.06  --  0.94 0.94  CALCIUM 7.9*  --  8.1*  --  8.0*  --  8.2* 8.1*  MG 1.4*  --  1.7  --  1.8  --  1.7 1.8  PHOS 2.9  --  2.4*  --  3.1  --  3.2  --    < > = values in this interval not displayed.   GFR Estimated Creatinine Clearance: 65.4 mL/min (by C-G formula based on SCr of 0.94 mg/dL). Liver Function Tests: Recent Labs  Lab 09/01/19 0609 09/02/19 0639 09/03/19 0632 09/04/19 0604 09/05/19 0543  AST 15 18 17 17 19   ALT 15 17 17 16 20   ALKPHOS 36* 38 33* 36* 33*  BILITOT 0.8 0.8 0.8 0.5 0.8  PROT 4.4* 5.1* 4.7* 4.8* 4.6*  ALBUMIN 2.8* 3.1* 3.0* 3.0* 2.9*   No results for input(s): LIPASE, AMYLASE in the last 168 hours. No results for input(s): AMMONIA in the last 168 hours. Coagulation profile Recent Labs  Lab 08/30/19 0617  INR 1.0    CBC: Recent Labs  Lab 09/01/19 0609 09/01/19 1230 09/02/19 0639 09/02/19 1155 09/03/19 0632 09/03/19 1214 09/03/19 1829 09/04/19 0005 09/04/19 0604 09/04/19 1314 09/05/19 0543  WBC 5.1  --  5.3  --  6.8  --   --   --  6.1  --  5.4  NEUTROABS 3.7  --  3.9  --  5.5  --   --   --  4.7  --  4.0  HGB 9.6*   < > 10.0*   < > 9.1*   < > 8.9* 8.4* 8.8* 8.5* 7.9*  HCT 29.5*   < > 30.0*   < > 27.3*   < > 26.4* 25.7* 25.9* 25.7* 24.1*  MCV 97.7  --  94.9  --  96.1  --   --   --  96.6  --  98.0  PLT 130*  --  141*  --  158  --   --   --  164  --  151   < > = values in this interval not displayed.   Cardiac Enzymes: No results for input(s): CKTOTAL, CKMB,  CKMBINDEX, TROPONINI in the last 168 hours. BNP (last 3 results) No results for input(s): PROBNP in the last 8760 hours. CBG: No results for input(s): GLUCAP in the last 168 hours. D-Dimer: No results for input(s): DDIMER in the last 72 hours. Hgb A1c: No results for input(s): HGBA1C in the last 72 hours. Lipid Profile: No results for input(s): CHOL, HDL, LDLCALC,  TRIG, CHOLHDL, LDLDIRECT in the last 72 hours. Thyroid function studies: No results for input(s): TSH, T4TOTAL, T3FREE, THYROIDAB in the last 72 hours.  Invalid input(s): FREET3 Anemia work up: No results for input(s): VITAMINB12, FOLATE, FERRITIN, TIBC, IRON, RETICCTPCT in the last 72 hours. Sepsis Labs: Recent Labs  Lab 09/02/19 0639 09/03/19 0632 09/04/19 0604 09/05/19 0543  WBC 5.3 6.8 6.1 5.4    Microbiology Recent Results (from the past 240 hour(s))  SARS Coronavirus 2 by RT PCR (hospital order, performed in [facility] hospital lab) Nasopharyngeal Nasopharyngeal Swab     Status: None   Collection Time: 08/30/19  9:37 AM   Specimen: Nasopharyngeal Swab  Result Value Ref Range Status   SARS Coronavirus 2 NEGATIVE NEGATIVE Final    Comment: (NOTE) SARS-CoV-2 target nucleic acids are NOT DETECTED.  The SARS-CoV-2 RNA is generally detectable in upper and lower respiratory specimens during the acute phase of infection. The lowest concentration of SARS-CoV-2 viral copies this assay can detect is 250 copies / mL. A negative result does not preclude SARS-CoV-2 infection and should not be used as the sole basis for treatment or other patient management decisions.  A negative result may occur with improper specimen collection / handling, submission of specimen other than nasopharyngeal swab, presence of viral mutation(s) within the areas targeted by this assay, and inadequate number of viral copies (<250 copies / mL). A negative result must be combined with clinical observations, patient history, and epidemiological information.  Fact Sheet for Patients:   StrictlyIdeas.no  Fact Sheet for Healthcare Providers: BankingDealers.co.za  This test is not yet approved or  cleared by the [place] FDA and has been authorized for detection and/or diagnosis of SARS-CoV-2 by FDA under an Emergency Use Authorization (EUA).  This EUA  will remain in effect (meaning this test can be used) for the duration of the COVID-19 declaration under Section 564(b)(1) of the Act, 21 U.S.C. section 360bbb-3(b)(1), unless the authorization is terminated or revoked sooner.  Performed at [facility], [address], [place], [place] [address]     Procedures and diagnostic studies:  US BIOPSY (LIVER)  Result Date: 09/04/2019 INDICATION: 86 year old with rectal bleeding and recently discovered rectal lesion. Patient has a new lesion in the right hepatic lobe and concern for metastatic disease. Tissue diagnosis is needed. EXAM: ULTRASOUND-GUIDED LIVER LESION BIOPSY MEDICATIONS: None. ANESTHESIA/SEDATION: Moderate (conscious) sedation was employed during this procedure. A total of Versed 1.0 mg and Fentanyl 25 mcg was administered intravenously. Moderate Sedation Time: 16 minutes. The patient's level of consciousness and vital signs were monitored continuously by radiology nursing throughout the procedure under my direct supervision. FLUOROSCOPY TIME:  None COMPLICATIONS: None immediate. PROCEDURE: Informed written consent was obtained from the patient after a thorough discussion of the procedural risks, benefits and alternatives. All questions were addressed. A timeout was performed prior to the initiation of the procedure. Liver was evaluated with ultrasound. Subtle lesion at the right hepatic dome was identified that corresponds with the lesion on the recent MRI and CT examinations. Right side of the abdomen was prepped with chlorhexidine and sterile field was created. Skin  and soft tissues were anesthetized using 1% lidocaine. Using ultrasound guidance, 17 gauge coaxial needle was directed into the right hepatic lobe and the tip was placed adjacent to the hepatic dome lesion. Total of 4 core biopsies were obtained with an 18 gauge core device. Specimens placed in formalin. 17 gauge needle was removed without complication. Bandage placed over the  puncture site. FINDINGS: Subtle 1.8 cm lesion at the right hepatic dome corresponds with the recent cross-sectional imaging. Needle position confirmed within the lesion. No immediate bleeding or hematoma formation. IMPRESSION: Ultrasound-guided core biopsy of the lesion at the right hepatic dome. Electronically Signed   By: Markus Daft M.D.   On: 09/04/2019 09:32    Medications:   . sodium chloride   Intravenous Once  . allopurinol  100 mg Oral Daily  . doxazosin  8 mg Oral QHS  . finasteride  5 mg Oral Daily  . hydrocortisone  25 mg Rectal BID  . lidocaine   Topical BID  . loratadine  10 mg Oral Daily  . pantoprazole  40 mg Oral Daily  . simvastatin  40 mg Oral q1800   Continuous Infusions:    LOS: 5 days   Geradine Girt  [facility]   How to contact the [facility] Attending or Consulting provider [place] or covering provider during after hours [place], for this patient?  1. Check the care team in [facility] and look for a) attending/consulting TRH provider listed and b) the [facility] team listed 2. Log into [email or web address] and use [place]'s universal password to access. If you do not have the password, please contact the hospital operator. 3. Locate the [facility] provider you are looking for under [facility] and page to a number that you can be directly reached. 4. If you still have difficulty reaching the provider, please page the [facility] (Director on Call) for the Hospitalists listed on amion for assistance.  09/05/2019, 2:16 PM

## 2019-09-05 NOTE — TOC Initial Note (Addendum)
Transition of Care [facility]) - Initial/Assessment Note    Patient Details  Name: Nicholas Hines MRN: [id number] Date of Birth: 07-27-1933  Transition of Care [facility]) CM/SW Contact:    Bartholomew Crews, RN Phone Number: [phone] 09/05/2019, 10:11 AM  Clinical Narrative:                  Spoke with patient at the bedside. PTA home alone. He has 3 sisters - 1 who is local. He has some DME in the home from when he had cared for his mother, but has not needed DME for himself. Still drives and has volunteered with [facility].   Verified PCP: Dr. Celene Squibb in [place]. Verified pharmacy: [facility] in [place].   Discussed home health recommendations. Patient is agreeable. Offered choice of agencies. Referral sent to [place] pending referral for RN, PT. Patient will need home health orders for RN, PT at discharge. Update: [facility] accepted referral for RN, PT.   [facility] referral for community case management sent to [facility] liaison.   Patient states that he will have transportation home when ready for discharge. TOC following for transition needs.   Expected Discharge Plan: [place] Barriers to Discharge: Continued Medical Work up   Patient Goals and CMS Choice Patient states their goals for this hospitalization and ongoing recovery are:: return home with home health and family support CMS Medicare.gov Compare Post Acute Care list provided to:: Patient Choice offered to / list presented to : Patient  Expected Discharge Plan and Services Expected Discharge Plan: [place] In-house Referral: [facility] Discharge Planning Services: CM Consult Post Acute Care Choice: [place] arrangements for the past 2 months: Single Family Home                 DME Arranged: N/A DME Agency: NA       HH Arranged: PT, RN [place] Agency: [place] ([place]) Date HH Agency Contacted: 09/05/19 Time HH Agency Contacted:  1010 Representative spoke with at [place]: Butch Penny  Prior Living Arrangements/Services Living arrangements for the past 2 months: [place] with:: Self Patient language and need for interpreter reviewed:: Yes Do you feel safe going back to the place where you live?: Yes      Need for Family Participation in Patient Care: Yes (Comment) Care giver support system in place?: Yes (comment) Current home services: DME (has some old DME from his mother) Criminal Activity/Legal Involvement Pertinent to Current Situation/Hospitalization: No - Comment as needed  Activities of Daily Living Home Assistive Devices/Equipment: None ADL Screening (condition at time of admission) Patient's cognitive ability adequate to safely complete daily activities?: Yes Is the patient deaf or have difficulty hearing?: No Does the patient have difficulty seeing, even when wearing glasses/contacts?: No Does the patient have difficulty concentrating, remembering, or making decisions?: No Patient able to express need for assistance with ADLs?: Yes Does the patient have difficulty dressing or bathing?: No Independently performs ADLs?: Yes (appropriate for developmental age) Does the patient have difficulty walking or climbing stairs?: No Weakness of Legs: None Weakness of Arms/Hands: None  Permission Sought/Granted Permission sought to share information with : Family Supports Permission granted to share information with : Yes, Verbal Permission Granted  Share Information with NAME: Margaretha Sheffield     Permission granted to share info w Relationship: sister  Permission granted to share info w Contact Information: [phone]  Emotional Assessment Appearance:: Appears  stated age Attitude/Demeanor/Rapport: Engaged Affect (typically observed): Accepting Orientation: : Oriented to Self, Oriented to  Time, Oriented to Place, Oriented to Situation Alcohol / Substance Use: Not Applicable Psych Involvement:  No (comment)  Admission diagnosis:  Rectal bleeding [K62.5] Patient Active Problem List   Diagnosis Date Noted  . Liver lesion, right lobe 09/02/2019  . Rectal bleeding 08/30/2019  . HLD (hyperlipidemia) 08/30/2019  . HTN (hypertension) 08/30/2019  . BPH (benign prostatic hyperplasia) 08/30/2019  . Gout 08/30/2019  . Allergic rhinitis 08/30/2019  . GERD (gastroesophageal reflux disease) 08/30/2019   PCP:  Celene Squibb, MD Pharmacy:   [facility], [address] [place] [address] Phone: [phone] Fax: [phone]     Social Determinants of Health (SDOH) Interventions    Readmission Risk Interventions No flowsheet data found.

## 2019-09-06 LAB — CBC WITH DIFFERENTIAL/PLATELET
Abs Immature Granulocytes: 0.09 10*3/uL — ABNORMAL HIGH (ref 0.00–0.07)
Basophils Absolute: 0 10*3/uL (ref 0.0–0.1)
Basophils Relative: 1 %
Eosinophils Absolute: 0.1 10*3/uL (ref 0.0–0.5)
Eosinophils Relative: 2 %
HCT: 28.2 % — ABNORMAL LOW (ref 39.0–52.0)
Hemoglobin: 9.3 g/dL — ABNORMAL LOW (ref 13.0–17.0)
Immature Granulocytes: 2 %
Lymphocytes Relative: 11 %
Lymphs Abs: 0.6 10*3/uL — ABNORMAL LOW (ref 0.7–4.0)
MCH: 32.2 pg (ref 26.0–34.0)
MCHC: 33 g/dL (ref 30.0–36.0)
MCV: 97.6 fL (ref 80.0–100.0)
Monocytes Absolute: 0.7 10*3/uL (ref 0.1–1.0)
Monocytes Relative: 11 %
Neutro Abs: 4.4 10*3/uL (ref 1.7–7.7)
Neutrophils Relative %: 73 %
Platelets: 179 10*3/uL (ref 150–400)
RBC: 2.89 MIL/uL — ABNORMAL LOW (ref 4.22–5.81)
RDW: 14 % (ref 11.5–15.5)
WBC: 5.9 10*3/uL (ref 4.0–10.5)
nRBC: 0.3 % — ABNORMAL HIGH (ref 0.0–0.2)

## 2019-09-06 LAB — COMPREHENSIVE METABOLIC PANEL
ALT: 19 U/L (ref 0–44)
AST: 19 U/L (ref 15–41)
Albumin: 2.9 g/dL — ABNORMAL LOW (ref 3.5–5.0)
Alkaline Phosphatase: 39 U/L (ref 38–126)
Anion gap: 8 (ref 5–15)
BUN: 10 mg/dL (ref 8–23)
CO2: 27 mmol/L (ref 22–32)
Calcium: 8.1 mg/dL — ABNORMAL LOW (ref 8.9–10.3)
Chloride: 105 mmol/L (ref 98–111)
Creatinine, Ser: 0.97 mg/dL (ref 0.61–1.24)
GFR calc Af Amer: >60 mL/min (ref >60)
GFR calc non Af Amer: >60 mL/min (ref >60)
Glucose, Bld: 102 mg/dL — ABNORMAL HIGH (ref 70–99)
Potassium: 3.4 mmol/L — ABNORMAL LOW (ref 3.5–5.1)
Sodium: 140 mmol/L (ref 135–145)
Total Bilirubin: 0.9 mg/dL (ref 0.3–1.2)
Total Protein: 4.6 g/dL — ABNORMAL LOW (ref 6.5–8.1)

## 2019-09-06 LAB — CORTISOL: Cortisol, Plasma: 12.3 ug/dL

## 2019-09-06 LAB — MAGNESIUM: Magnesium: 1.7 mg/dL (ref 1.7–2.4)

## 2019-09-06 MED ORDER — POTASSIUM CHLORIDE CRYS ER 20 MEQ PO TBCR
40.0000 meq | EXTENDED_RELEASE_TABLET | Freq: Once | ORAL | Status: AC
  Administered 2019-09-06: 40 meq via ORAL
  Filled 2019-09-06: qty 2

## 2019-09-06 MED ORDER — MAGNESIUM CHLORIDE 64 MG PO TBEC
1.0000 | DELAYED_RELEASE_TABLET | Freq: Two times a day (BID) | ORAL | Status: DC
  Administered 2019-09-06 – 2019-09-07 (×2): 64 mg via ORAL
  Filled 2019-09-06 (×2): qty 1

## 2019-09-06 MED ORDER — DOXAZOSIN MESYLATE 2 MG PO TABS
4.0000 mg | ORAL_TABLET | Freq: Every day | ORAL | Status: DC
  Administered 2019-09-06: 4 mg via ORAL
  Filled 2019-09-06: qty 2

## 2019-09-06 MED ORDER — MIDODRINE HCL 5 MG PO TABS
2.5000 mg | ORAL_TABLET | Freq: Three times a day (TID) | ORAL | Status: DC
  Administered 2019-09-06 – 2019-09-07 (×3): 2.5 mg via ORAL
  Filled 2019-09-06 (×3): qty 1

## 2019-09-06 MED ORDER — DOCUSATE SODIUM 100 MG PO CAPS
100.0000 mg | ORAL_CAPSULE | Freq: Two times a day (BID) | ORAL | Status: DC
  Administered 2019-09-06 – 2019-09-07 (×3): 100 mg via ORAL
  Filled 2019-09-06 (×3): qty 1

## 2019-09-06 NOTE — Progress Notes (Signed)
Progress Note    Nicholas Hines  YHC:[id number] DOB: [birth year]  DOA: 08/30/2019 PCP: Celene Squibb, MD    Brief Narrative:     Medical records reviewed and are as summarized below:  Nicholas Hines is an 86 y.o. male who presented with rectal bleeding and flex sig  showed an ulcerated rectal mass concerning for malignancy.  Patient was also found to have a lesion on the right lobe of his liver and MRI shows that is concerning for metastatic disease.  Preliminary biopsy report shows carcinoid tumor.  Plan per IR is to get a biopsy to rule out metastatic carcinoid tumor.  He will then follow up outpatient with Dr. Morton Stall at [facility].   Assessment/Plan:   Principal Problem:   Rectal bleeding Active Problems:   HLD (hyperlipidemia)   HTN (hypertension)   BPH (benign prostatic hyperplasia)   Gout   Allergic rhinitis   GERD (gastroesophageal reflux disease)   Liver lesion, right lobe   Rectal mass/Rectal bleeding -path report: small cell carcinoma -Per patient  discussion with both Dr. Alessandra Bevels and his sister he prefers to follow up at [facility].  -patient's sister Violeta Gelinas) has spoken with Dr. Lenise Arena who is a surgical oncologist who will be seeing patient as an outpatient -Discussed with Dr. Alessandra Bevels today regarding anticipated continued intermittent rectal bleeding until surgery -IV iron x1 -Patient also need oral iron replacement Patient will also need Anusol suppositories for 2 to 4 weeks  Symptomatic anemia with positive orthostatics -s/p 1 unit PRBC -repeat orthostatics improved but still + -start midodrine -recheck orthos -cortisol normal  RIGHT lobe Liver lesion -CT scan showed new liver lesion which needs further evaluation with MRI.  See results below -MRI liver demonstrates 1.9 cm rim lesion in the posterior right liver concerning for mets  -Status post biopsy on 7/1  HTN, -DC meds  Hypokalemia -Replete  Hypomagnesmia -Replete  PT  eval  Family Communication/Anticipated D/C date and plan/Code Status   DVT prophylaxis: scd Code Status: Full Code.  Family Communication: sister 7/1 Disposition Plan: Status is: Inpatient  Remains inpatient appropriate because:Ongoing diagnostic testing needed not appropriate for outpatient work up   Dispo: The patient is from: Home              Anticipated d/c is to: Home              Anticipated d/c date is: home once no longer orthostatic              Patient currently is not medically stable to d/c.         Medical Consultants:    GI  Subjective:   No further bleeding overnight symptomatic with orthos  Objective:    Vitals:   09/05/19 1250 09/05/19 2104 09/06/19 0600 09/06/19 0900  BP: 114/73 136/77 127/73 107/73  Pulse: (!) 104 87 86 91  Resp: 18 14 16 16   Temp: 97.8 F (36.6 C) 98.1 F (36.7 C) 98.5 F (36.9 C) 98.3 F (36.8 C)  TempSrc: Oral Oral Oral Oral  SpO2: 97% 98% 98% 98%  Weight:  88.4 kg    Height:        Intake/Output Summary (Last 24 hours) at 09/06/2019 1536 Last data filed at 09/06/2019 1300 Gross per 24 hour  Intake 600 ml  Output 0 ml  Net 600 ml   Filed Weights   09/03/19 0420 09/04/19 0503 09/05/19 2104  Weight: 89.2 kg 90.2 kg 88.4 kg  Exam:   In bed, NAD rrr No increased work of breathing    Data Reviewed:   I have personally reviewed following labs and imaging studies:  Labs: Labs show the following:   Basic Metabolic Panel: Recent Labs  Lab 09/01/19 0609 09/01/19 0609 09/02/19 8185 09/02/19 0639 09/03/19 6314 09/03/19 0632 09/04/19 0604 09/04/19 0604 09/05/19 0543 09/06/19 0413  NA 141   < > 139  --  137  --  137  --  138 140  K 3.3*   < > 3.1*   < > 3.7   < > 3.0*   < > 3.3* 3.4*  CL 108   < > 104  --  105  --  103  --  104 105  CO2 26   < > 26  --  23  --  27  --  26 27  GLUCOSE 115*   < > 108*  --  139*  --  122*  --  112* 102*  BUN 12   < > 8  --  12  --  10  --  12 10  CREATININE 0.94    < > 0.93  --  1.06  --  0.94  --  0.94 0.97  CALCIUM 7.9*   < > 8.1*  --  8.0*  --  8.2*  --  8.1* 8.1*  MG 1.4*   < > 1.7  --  1.8  --  1.7  --  1.8 1.7  PHOS 2.9  --  2.4*  --  3.1  --  3.2  --   --   --    < > = values in this interval not displayed.   GFR Estimated Creatinine Clearance: 63.3 mL/min (by C-G formula based on SCr of 0.97 mg/dL). Liver Function Tests: Recent Labs  Lab 09/02/19 0639 09/03/19 0632 09/04/19 0604 09/05/19 0543 09/06/19 0413  AST 18 17 17 19 19   ALT 17 17 16 20 19   ALKPHOS 38 33* 36* 33* 39  BILITOT 0.8 0.8 0.5 0.8 0.9  PROT 5.1* 4.7* 4.8* 4.6* 4.6*  ALBUMIN 3.1* 3.0* 3.0* 2.9* 2.9*   No results for input(s): LIPASE, AMYLASE in the last 168 hours. No results for input(s): AMMONIA in the last 168 hours. Coagulation profile No results for input(s): INR, PROTIME in the last 168 hours.  CBC: Recent Labs  Lab 09/02/19 0639 09/02/19 1155 09/03/19 0632 09/03/19 1214 09/04/19 0604 09/04/19 1314 09/05/19 0543 09/05/19 1530 09/06/19 0413  WBC 5.3  --  6.8  --  6.1  --  5.4  --  5.9  NEUTROABS 3.9  --  5.5  --  4.7  --  4.0  --  4.4  HGB 10.0*   < > 9.1*   < > 8.8* 8.5* 7.9* 10.0* 9.3*  HCT 30.0*   < > 27.3*   < > 25.9* 25.7* 24.1* 29.8* 28.2*  MCV 94.9  --  96.1  --  96.6  --  98.0  --  97.6  PLT 141*  --  158  --  164  --  151  --  179   < > = values in this interval not displayed.   Cardiac Enzymes: No results for input(s): CKTOTAL, CKMB, CKMBINDEX, TROPONINI in the last 168 hours. BNP (last 3 results) No results for input(s): PROBNP in the last 8760 hours. CBG: No results for input(s): GLUCAP in the last 168 hours. D-Dimer: No results for input(s): DDIMER in the last 72  hours. Hgb A1c: No results for input(s): HGBA1C in the last 72 hours. Lipid Profile: No results for input(s): CHOL, HDL, LDLCALC, TRIG, CHOLHDL, LDLDIRECT in the last 72 hours. Thyroid function studies: No results for input(s): TSH, T4TOTAL, T3FREE, THYROIDAB in the  last 72 hours.  Invalid input(s): FREET3 Anemia work up: No results for input(s): VITAMINB12, FOLATE, FERRITIN, TIBC, IRON, RETICCTPCT in the last 72 hours. Sepsis Labs: Recent Labs  Lab 09/03/19 6237 09/04/19 0604 09/05/19 0543 09/06/19 0413  WBC 6.8 6.1 5.4 5.9    Microbiology Recent Results (from the past 240 hour(s))  SARS Coronavirus 2 by RT PCR (hospital order, performed in [facility] hospital lab) Nasopharyngeal Nasopharyngeal Swab     Status: None   Collection Time: 08/30/19  9:37 AM   Specimen: Nasopharyngeal Swab  Result Value Ref Range Status   SARS Coronavirus 2 NEGATIVE NEGATIVE Final    Comment: (NOTE) SARS-CoV-2 target nucleic acids are NOT DETECTED.  The SARS-CoV-2 RNA is generally detectable in upper and lower respiratory specimens during the acute phase of infection. The lowest concentration of SARS-CoV-2 viral copies this assay can detect is 250 copies / mL. A negative result does not preclude SARS-CoV-2 infection and should not be used as the sole basis for treatment or other patient management decisions.  A negative result may occur with improper specimen collection / handling, submission of specimen other than nasopharyngeal swab, presence of viral mutation(s) within the areas targeted by this assay, and inadequate number of viral copies (<250 copies / mL). A negative result must be combined with clinical observations, patient history, and epidemiological information.  Fact Sheet for Patients:   StrictlyIdeas.no  Fact Sheet for Healthcare Providers: BankingDealers.co.za  This test is not yet approved or  cleared by the [place] FDA and has been authorized for detection and/or diagnosis of SARS-CoV-2 by FDA under an Emergency Use Authorization (EUA).  This EUA will remain in effect (meaning this test can be used) for the duration of the COVID-19 declaration under Section 564(b)(1) of the Act, 21  U.S.C. section 360bbb-3(b)(1), unless the authorization is terminated or revoked sooner.  Performed at [facility], [address] [place], [place] [address]     Procedures and diagnostic studies:  No results found.  Medications:   . sodium chloride   Intravenous Once  . allopurinol  100 mg Oral Daily  . docusate sodium  100 mg Oral BID  . doxazosin  8 mg Oral QHS  . finasteride  5 mg Oral Daily  . hydrocortisone  25 mg Rectal BID  . lidocaine   Topical BID  . loratadine  10 mg Oral Daily  . midodrine  2.5 mg Oral TID WC  . pantoprazole  40 mg Oral Daily  . simvastatin  40 mg Oral q1800   Continuous Infusions:    LOS: 6 days   Geradine Girt  [facility]   How to contact the [place] Attending or Consulting provider [place] or covering provider during after hours [place], for this patient?  1. Check the care team in [facility] and look for a) attending/consulting [facility] provider listed and b) the [facility] team listed 2. Log into [email or web address] and use [place]'s universal password to access. If you do not have the password, please contact the hospital operator. 3. Locate the [facility] provider you are looking for under [facility] and page to a number that you can be directly reached. 4. If you still have difficulty reaching the provider,  please page the [facility] (Director on Call) for the Hospitalists listed on amion for assistance.  09/06/2019, 3:36 PM

## 2019-09-07 LAB — CBC WITH DIFFERENTIAL/PLATELET
Abs Immature Granulocytes: 0.09 10*3/uL — ABNORMAL HIGH (ref 0.00–0.07)
Basophils Absolute: 0 10*3/uL (ref 0.0–0.1)
Basophils Relative: 1 %
Eosinophils Absolute: 0.1 10*3/uL (ref 0.0–0.5)
Eosinophils Relative: 2 %
HCT: 28.9 % — ABNORMAL LOW (ref 39.0–52.0)
Hemoglobin: 9.3 g/dL — ABNORMAL LOW (ref 13.0–17.0)
Immature Granulocytes: 2 %
Lymphocytes Relative: 11 %
Lymphs Abs: 0.7 10*3/uL (ref 0.7–4.0)
MCH: 31.5 pg (ref 26.0–34.0)
MCHC: 32.2 g/dL (ref 30.0–36.0)
MCV: 98 fL (ref 80.0–100.0)
Monocytes Absolute: 0.7 10*3/uL (ref 0.1–1.0)
Monocytes Relative: 11 %
Neutro Abs: 4.6 10*3/uL (ref 1.7–7.7)
Neutrophils Relative %: 73 %
Platelets: 201 10*3/uL (ref 150–400)
RBC: 2.95 MIL/uL — ABNORMAL LOW (ref 4.22–5.81)
RDW: 13.8 % (ref 11.5–15.5)
WBC: 6.2 10*3/uL (ref 4.0–10.5)
nRBC: 0.3 % — ABNORMAL HIGH (ref 0.0–0.2)

## 2019-09-07 LAB — TYPE AND SCREEN
ABO/RH(D): O POS
Antibody Screen: NEGATIVE
Unit division: 0

## 2019-09-07 LAB — BPAM RBC
Blood Product Expiration Date: 202108022359
ISSUE DATE / TIME: 202107020914
Unit Type and Rh: 5100

## 2019-09-07 MED ORDER — HYDROCORTISONE ACETATE 25 MG RE SUPP: 25.0000 mg | Freq: Two times a day (BID) | RECTAL | 0 refills | Status: DC

## 2019-09-07 MED ORDER — MIDODRINE HCL 5 MG PO TABS: 5.0000 mg | ORAL_TABLET | Freq: Three times a day (TID) | ORAL | Status: DC

## 2019-09-07 MED ORDER — MIDODRINE HCL 5 MG PO TABS: 5.0000 mg | ORAL_TABLET | Freq: Three times a day (TID) | ORAL | 0 refills | Status: DC

## 2019-09-07 MED ORDER — DOXAZOSIN MESYLATE 4 MG PO TABS: 4.0000 mg | ORAL_TABLET | Freq: Every day | ORAL | Status: AC

## 2019-09-07 MED ORDER — LIDOCAINE 5 % EX OINT: TOPICAL_OINTMENT | Freq: Two times a day (BID) | CUTANEOUS | 0 refills | Status: DC

## 2019-09-07 MED ORDER — DOCUSATE SODIUM 100 MG PO CAPS: 100.0000 mg | ORAL_CAPSULE | Freq: Two times a day (BID) | ORAL | 0 refills | Status: DC

## 2019-09-07 NOTE — Progress Notes (Signed)
DISCHARGE NOTE HOME [facility] to be discharged to home per MD order. Discussed prescriptions and follow up appointments with the patient. Prescriptions given to patient; medication list explained in detail. Patient verbalized understanding.  Skin clean, dry and intact without evidence of skin break down, no evidence of skin tears noted. IV catheter discontinued intact. Site without signs and symptoms of complications. Dressing and pressure applied. Pt denies pain at the site currently. No complaints noted.  Patient free of lines, drains, and wounds.   An After Visit Summary (AVS) was printed and given to the patient. Patient escorted via wheelchair, and discharged home via private auto.  [place], Zenon Mayo, RN

## 2019-09-07 NOTE — Discharge Instructions (Signed)
Continue OTC IRON as well

## 2019-09-07 NOTE — Discharge Summary (Signed)
Physician Discharge Summary  Nicholas Hines JSH:[id number] DOB: Oct 14, 1933 DOA: 08/30/2019  PCP: Celene Squibb, MD  Admit date: 08/30/2019 Discharge date: 09/07/2019  Admitted From: home Discharge disposition: home   Recommendations for Outpatient Follow-Up:   1. Outpatient follow up with Dr. Morton Stall (surgical onc) 2. Cbc 1 week   Discharge Diagnosis:   Principal Problem:   Rectal bleeding Active Problems:   HLD (hyperlipidemia)   HTN (hypertension)   BPH (benign prostatic hyperplasia)   Gout   Allergic rhinitis   GERD (gastroesophageal reflux disease)   Liver lesion, right lobe    Discharge Condition: Improved.  Diet recommendation:   Regular.  Wound care: None.     History of Present Illness:   Nicholas Hines is a 86 y.o. male with medical history significant of BPH, hyperlipidemia, hypertension, gout and internal hemorrhoids; who presented to the emergency department secondary to rectal pain and bright red blood per rectum.  Patient reports having approximately a month of rectal bleeding for what he had follow-up with his gastroenterologist in [facility]).  He expressed that despite multiple treatments he continued to experience sudden intermittent bright red blood per rectum.  Around 2 AM in the morning today prior to come to the emergency department he developed the largest amount of bright red blood that he had ever seen throughout the length of his condition; he also reports associated rectal pain.  Patient reports no fever, no chills, no nausea, no vomiting, no abdominal pain, no focal weakness, no headaches, no dysuria or hematuria, no other complaints.   Hospital Course by Problem:   Rectal mass/Rectal bleeding -path report: small cell carcinoma -Per patient  discussion with both Dr. Alessandra Bevels and his sister he prefers to follow up at [place].  -patient's sister Nicholas Hines) has spoken with Dr. Lenise Arena who is a surgical oncologist  who will be seeing patient as an outpatient -Discussed with Dr. Alessandra Bevels today regarding anticipated continued intermittent rectal bleeding until surgery -IV iron x1 -Patient also need oral iron replacement Patient will also need Anusol suppositories for 2 to 4 weeks  Symptomatic anemia with positive orthostatics -s/p 1 unit PRBC -givin IV Fe x 1 -repeat orthostatics improved  -start midodrine -cortisol normal  RIGHT lobeLiver lesion -CT scan showed new liver lesion which needs further evaluation with MRI. 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   The results of significant diagnostics from this hospitalization (including imaging, microbiology, ancillary and laboratory) are listed below for reference.     Procedures and Diagnostic Studies:   CT ABDOMEN PELVIS W CONTRAST  Addendum Date: 09/04/2019   ADDENDUM REPORT: 09/04/2019 09:01 ADDENDUM: Given interval history of rectal mass, previously noted left-sided peri rectal nodules, previously felt to represent diverticuli, are now favored to represent pathologically enlarged perirectal lymph nodes, with index left-sided perirectal lymph node measuring 1.5 cm in greatest short axis diameter (image 79, series 2). This could be further evaluated with pelvic MRI  as indicated. Electronically Signed   By: Sandi Mariscal M.D.   On: 09/04/2019 09:01   Result Date: 09/04/2019 CLINICAL DATA:  Melena.  Rectal pain. EXAM: CT ABDOMEN AND PELVIS WITH CONTRAST TECHNIQUE: Multidetector CT imaging of the abdomen and pelvis was performed using the standard protocol following bolus administration of intravenous contrast. CONTRAST:  154mL OMNIPAQUE IOHEXOL 300 MG/ML  SOLN COMPARISON:  01/30/2018 FINDINGS: Lower chest: Limited visualization of the lower thorax demonstrates minimal dependent subpleural ground-glass atelectasis, left greater than right. No discrete focal airspace opacities. No pleural effusion. Normal heart size. Coronary artery calcifications. No pericardial effusion. Hepatobiliary: Normal hepatic contour. Interval development of an approximately 1.1 cm hypoattenuating lesion within the dome of the right lobe of the liver, incompletely characterized on present examination. Additional scattered subcentimeter hypoattenuating [place] lesions are too small to accurately characterize though similar to the 2019 examination favored to represent hepatic cysts. Normal appearance of the gallbladder given degree distention. No radiopaque gallstones. No intra or extrahepatic biliary ductal dilatation. No ascites. Pancreas: Normal appearance of the pancreas. Spleen: Normal appearance of the spleen. Adrenals/Urinary Tract: There is symmetric enhancement and excretion of the bilateral kidneys. Redemonstrated left-sided renal cysts with dominant partially exophytic cyst arising from the inferolateral aspect the left kidney measuring 5.3 cm in diameter. No discrete right-sided renal lesions. Note is made of 4 punctate (sub 4 mm) left-sided renal stones (coronal images 68, 70, 77 and 79, series 5 as well as a solitary 3 mm nonobstructing right-sided renal stone (coronal image 59, series 5). Minimal grossly symmetric bilateral perinephric stranding. No evidence of urinary obstruction. Normal  appearance the bilateral adrenal glands. There is mass effect of the prostate gland on the undersurface of the urinary bladder. Otherwise, normal appearance of the urinary bladder given degree of distention. Stomach/Bowel: Linear high density debris seen within the rectum (coronal image 95, series 5; axial images 80 through 85, series 2, potentially ingested radiopaque debris though an area of intraluminal contrast extravasation could have a similar appearance. Note, pre contrast and delayed contrast images were not obtained. Scattered colonic diverticulosis without evidence of superimposed acute diverticulitis. Moderate colonic stool burden without evidence of enteric obstruction. Normal appearance of the terminal ileum and the retrocecal appendix. No discrete areas of bowel wall thickening. No pneumoperitoneum, pneumatosis or portal venous gas. Vascular/Lymphatic: Minimal amount of mixed calcified and noncalcified atherosclerotic plaque throughout the normal caliber abdominal aorta, not resulting in a hemodynamically significant stenosis on this non CTA examination. 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 No bulky retroperitoneal, mesenteric, pelvic or inguinal lymphadenopathy. Reproductive: Prostatomegaly, in particular, hypertrophy with mass effect upon the undersurface of the urinary bladder. Other: Small mesenteric fat containing periumbilical hernia. There is a minimal amount of subcutaneous edema about the midline of the low back. Musculoskeletal: Stigmata of dish within the thoracic spine. Mild to moderate multilevel lumbar spine DDD, worse at L5-S1 with disc space height loss, endplate irregularity and sclerosis. IMPRESSION: 1. Linear radiopaque debris within the rectum, potentially ingested debris, though intraluminal contrast extravasation could have a similar appearance, incompletely evaluated on this non multiphase examination. Clinical correlation is advised.  Further evaluation with  multiphase GI bleeding protocol CT scan of the abdomen pelvis could be performed as indicated. 2. Colonic diverticulosis without evidence superimposed acute diverticulitis. 3. Small hiatal hernia. 4. Nonobstructing bilateral nephrolithiasis, left greater than right. 5. Coronary calcifications.  Aortic Atherosclerosis (ICD10-I70.0). 6. Interval development of an approximately 1.1 cm hypoattenuating lesion within the dome of the right lobe of the liver, not seen on the 2019 examination though incompletely characterized on the present examination. Further evaluation with nonemergent abdominal MRI could be performed as indicated. Critical Value/emergent results were called by telephone at the time of interpretation on 08/30/2019 at 9:07 am to provider [facility] , who verbally acknowledged these results. Electronically Signed: By: Sandi Mariscal M.D. On: 08/30/2019 09:12     Labs:   Basic Metabolic Panel: Recent Labs  Lab 09/01/19 0609 09/01/19 2353 09/02/19 6144 09/02/19 3154 09/03/19 0086 09/03/19 7619 09/04/19 0604 09/04/19 0604 09/05/19 0543 09/06/19 0413  NA 141   < > 139  --  137  --  137  --  138 140  K 3.3*   < > 3.1*   < > 3.7   < > 3.0*   < > 3.3* 3.4*  CL 108   < > 104  --  105  --  103  --  104 105  CO2 26   < > 26  --  23  --  27  --  26 27  GLUCOSE 115*   < > 108*  --  139*  --  122*  --  112* 102*  BUN 12   < > 8  --  12  --  10  --  12 10  CREATININE 0.94   < > 0.93  --  1.06  --  0.94  --  0.94 0.97  CALCIUM 7.9*   < > 8.1*  --  8.0*  --  8.2*  --  8.1* 8.1*  MG 1.4*   < > 1.7  --  1.8  --  1.7  --  1.8 1.7  PHOS 2.9  --  2.4*  --  3.1  --  3.2  --   --   --    < > = values in this interval not displayed.   GFR Estimated Creatinine Clearance: 63.3 mL/min (by C-G formula based on SCr of 0.97 mg/dL). Liver Function Tests: Recent Labs  Lab 09/02/19 0639 09/03/19 0632 09/04/19 0604 09/05/19 0543 09/06/19 0413  AST 18 17 17 19 19   ALT 17 17 16 20 19   ALKPHOS 38 33*  36* 33* 39  BILITOT 0.8 0.8 0.5 0.8 0.9  PROT 5.1* 4.7* 4.8* 4.6* 4.6*  ALBUMIN 3.1* 3.0* 3.0* 2.9* 2.9*   No results for input(s): LIPASE, AMYLASE in the last 168 hours. No results for input(s): AMMONIA in the last 168 hours. Coagulation profile No results for input(s): INR, PROTIME in the last 168 hours.  CBC: Recent Labs  Lab 09/03/19 0632 09/03/19 1214 09/04/19 0604 09/04/19 0604 09/04/19 1314 09/05/19 0543 09/05/19 1530 09/06/19 0413 09/07/19 0339  WBC 6.8  --  6.1  --   --  5.4  --  5.9 6.2  NEUTROABS 5.5  --  4.7  --   --  4.0  --  4.4 4.6  HGB 9.1*   < > 8.8*   < > 8.5* 7.9* 10.0* 9.3* 9.3*  HCT 27.3*   < > 25.9*   < > 25.7* 24.1* 29.8* 28.2* 28.9*  MCV 96.1  --  96.6  --   --  98.0  --  97.6 98.0  PLT 158  --  164  --   --  151  --  179 201   < > = values in this interval not displayed.   Cardiac Enzymes: No results for input(s): CKTOTAL, CKMB, CKMBINDEX, TROPONINI in the last 168 hours. BNP: Invalid input(s): POCBNP CBG: No results for input(s): GLUCAP in the last 168 hours. D-Dimer No results for input(s): DDIMER in the last 72 hours. Hgb A1c No results for input(s): HGBA1C in the last 72 hours. Lipid Profile No results for input(s): CHOL, HDL, LDLCALC, TRIG, CHOLHDL, LDLDIRECT in the last 72 hours. Thyroid function studies No results for input(s): TSH, T4TOTAL, T3FREE, THYROIDAB in the last 72 hours.  Invalid input(s): FREET3 Anemia work up No results for input(s): VITAMINB12, FOLATE, FERRITIN, TIBC, IRON, RETICCTPCT in the last 72 hours. Microbiology Recent Results (from the past 240 hour(s))  SARS Coronavirus 2 by RT PCR (hospital order, performed in [facility] hospital lab) Nasopharyngeal Nasopharyngeal Swab     Status: None   Collection Time: 08/30/19  9:37 AM   Specimen: Nasopharyngeal Swab  Result Value Ref Range Status   SARS Coronavirus 2 NEGATIVE NEGATIVE Final    Comment: (NOTE) SARS-CoV-2 target nucleic acids are NOT DETECTED.  The  SARS-CoV-2 RNA is generally detectable in upper and lower respiratory specimens during the acute phase of infection. The lowest concentration of SARS-CoV-2 viral copies this assay can detect is 250 copies / mL. A negative result does not preclude SARS-CoV-2 infection and should not be used as the sole basis for treatment or other patient management decisions.  A negative result may occur with improper specimen collection / handling, submission of specimen other than nasopharyngeal swab, presence of viral mutation(s) within the areas targeted by this assay, and inadequate number of viral copies (<250 copies / mL). A negative result must be combined with clinical observations, patient history, and epidemiological information.  Fact Sheet for Patients:   StrictlyIdeas.no  Fact Sheet for Healthcare Providers: BankingDealers.co.za  This test is not yet approved or  cleared by the [place] FDA and has been authorized for detection and/or diagnosis of SARS-CoV-2 by FDA under an Emergency Use Authorization (EUA).  This EUA will remain in effect (meaning this test can be used) for the duration of the COVID-19 declaration under Section 564(b)(1) of the Act, 21 U.S.C. section 360bbb-3(b)(1), unless the authorization is terminated or revoked sooner.  Performed at [facility], [address] [place], [place] [address]      Discharge Instructions:   Discharge Instructions    Discharge instructions   Complete by: As directed    Cbc 1 week Would take OTC magnesium daily Follow up with Dr. Morton Stall   Increase activity slowly   Complete by: As directed    No wound care   Complete by: As directed      Allergies as of 09/07/2019      Reactions   Sulfa Antibiotics Rash      Medication List    STOP taking these medications   aspirin 81 MG chewable tablet   budesonide 3 MG 24 hr capsule Commonly known as: ENTOCORT EC   NONFORMULARY OR  COMPOUNDED ITEM   triamterene-hydrochlorothiazide 75-50 MG tablet Commonly known as: MAXZIDE     TAKE these medications   ACIDOPHILUS PROBIOTIC PO Take 1 tablet by mouth daily.   allopurinol 100 MG tablet Commonly known as: ZYLOPRIM Take 100 mg by  mouth daily.   BENEFIBER PO Take by mouth See admin instructions. Mix 2 teaspoonfuls with coffee and drink every morning   CENTRUM SILVER 50+MEN PO Take 1 tablet by mouth daily.   cholecalciferol 25 MCG (1000 UNIT) tablet Commonly known as: VITAMIN D3 Take 1,000 Units by mouth daily.   docusate sodium 100 MG capsule Commonly known as: COLACE Take 1 capsule (100 mg total) by mouth 2 (two) times daily.   doxazosin 4 MG tablet Commonly known as: CARDURA Take 1 tablet (4 mg total) by mouth at bedtime. What changed:   medication strength  how much to take  when to take this   esomeprazole 40 MG capsule Commonly known as: NEXIUM Take 40 mg by mouth daily at 12 noon.   finasteride 5 MG tablet Commonly known as: PROSCAR Take 5 mg by mouth daily.   GAS-X PO Take 1 tablet by mouth 3 (three) times daily as needed (for bloating).   hydrocortisone 25 MG suppository Commonly known as: ANUSOL-HC Place 1 suppository (25 mg total) rectally 2 (two) times daily.   levocetirizine 5 MG tablet Commonly known as: XYZAL Take 5 mg by mouth every evening.   lidocaine 5 % ointment Commonly known as: XYLOCAINE Apply topically 2 (two) times daily.   midodrine 5 MG tablet Commonly known as: PROAMATINE Take 1 tablet (5 mg total) by mouth 3 (three) times daily with meals.   polyethylene glycol 17 g packet Commonly known as: MIRALAX / GLYCOLAX Take 17 g by mouth daily as needed for mild constipation or moderate constipation.   potassium citrate 10 MEQ (1080 MG) SR tablet Commonly known as: UROCIT-K Take 20 mEq by mouth in the morning and at bedtime.   simvastatin 40 MG tablet Commonly known as: ZOCOR Take 40 mg by mouth daily.         Follow-up Information    [facility] Follow up.   Why: the office will call to schedule home health visits Contact information: [phone] [address] [place], [place] [address]       Celene Squibb, MD Follow up in 1 week(s).   Specialty: Internal Medicine Contact information: [facility] [address] [phone]        Cheryll Cockayne, MD Follow up.   Specialty: Surgical Oncology Contact information: [place] [address] [phone]                Time coordinating discharge: 35 min  Signed:  Geradine Girt DO  [facility] 09/07/2019, 10:13 AM

## 2019-09-11 DIAGNOSIS — C20 Malignant neoplasm of rectum: Secondary | ICD-10-CM | POA: Diagnosis not present

## 2019-09-11 DIAGNOSIS — C787 Secondary malignant neoplasm of liver and intrahepatic bile duct: Secondary | ICD-10-CM | POA: Diagnosis not present

## 2019-09-11 DIAGNOSIS — Z7189 Other specified counseling: Secondary | ICD-10-CM | POA: Diagnosis not present

## 2019-09-11 DIAGNOSIS — Z809 Family history of malignant neoplasm, unspecified: Secondary | ICD-10-CM | POA: Diagnosis not present

## 2019-09-18 DIAGNOSIS — D509 Iron deficiency anemia, unspecified: Secondary | ICD-10-CM | POA: Diagnosis not present

## 2019-09-18 DIAGNOSIS — I951 Orthostatic hypotension: Secondary | ICD-10-CM | POA: Diagnosis not present

## 2019-09-18 DIAGNOSIS — C787 Secondary malignant neoplasm of liver and intrahepatic bile duct: Secondary | ICD-10-CM | POA: Diagnosis not present

## 2019-09-18 DIAGNOSIS — C20 Malignant neoplasm of rectum: Secondary | ICD-10-CM | POA: Diagnosis not present

## 2019-09-19 DIAGNOSIS — Z79899 Other long term (current) drug therapy: Secondary | ICD-10-CM | POA: Diagnosis not present

## 2019-09-19 DIAGNOSIS — C7B8 Other secondary neuroendocrine tumors: Secondary | ICD-10-CM | POA: Diagnosis not present

## 2019-09-19 DIAGNOSIS — C20 Malignant neoplasm of rectum: Secondary | ICD-10-CM | POA: Diagnosis not present

## 2019-09-23 DIAGNOSIS — C787 Secondary malignant neoplasm of liver and intrahepatic bile duct: Secondary | ICD-10-CM | POA: Diagnosis not present

## 2019-09-23 DIAGNOSIS — Z833 Family history of diabetes mellitus: Secondary | ICD-10-CM | POA: Diagnosis not present

## 2019-09-23 DIAGNOSIS — K625 Hemorrhage of anus and rectum: Secondary | ICD-10-CM | POA: Diagnosis not present

## 2019-09-23 DIAGNOSIS — Z882 Allergy status to sulfonamides status: Secondary | ICD-10-CM | POA: Diagnosis not present

## 2019-09-23 DIAGNOSIS — Z5111 Encounter for antineoplastic chemotherapy: Secondary | ICD-10-CM | POA: Diagnosis not present

## 2019-09-23 DIAGNOSIS — Z803 Family history of malignant neoplasm of breast: Secondary | ICD-10-CM | POA: Diagnosis not present

## 2019-09-23 DIAGNOSIS — Z7982 Long term (current) use of aspirin: Secondary | ICD-10-CM | POA: Diagnosis not present

## 2019-09-23 DIAGNOSIS — K6289 Other specified diseases of anus and rectum: Secondary | ICD-10-CM | POA: Diagnosis not present

## 2019-09-23 DIAGNOSIS — K5900 Constipation, unspecified: Secondary | ICD-10-CM | POA: Diagnosis not present

## 2019-09-23 DIAGNOSIS — C20 Malignant neoplasm of rectum: Secondary | ICD-10-CM | POA: Diagnosis not present

## 2019-09-23 DIAGNOSIS — Z8249 Family history of ischemic heart disease and other diseases of the circulatory system: Secondary | ICD-10-CM | POA: Diagnosis not present

## 2019-09-23 DIAGNOSIS — C801 Malignant (primary) neoplasm, unspecified: Secondary | ICD-10-CM | POA: Diagnosis not present

## 2019-09-23 DIAGNOSIS — Z79899 Other long term (current) drug therapy: Secondary | ICD-10-CM | POA: Diagnosis not present

## 2019-09-23 DIAGNOSIS — G893 Neoplasm related pain (acute) (chronic): Secondary | ICD-10-CM | POA: Diagnosis not present

## 2019-09-24 DIAGNOSIS — C20 Malignant neoplasm of rectum: Secondary | ICD-10-CM | POA: Diagnosis not present

## 2019-09-24 DIAGNOSIS — C787 Secondary malignant neoplasm of liver and intrahepatic bile duct: Secondary | ICD-10-CM | POA: Diagnosis not present

## 2019-09-24 DIAGNOSIS — Z5111 Encounter for antineoplastic chemotherapy: Secondary | ICD-10-CM | POA: Diagnosis not present

## 2019-09-25 DIAGNOSIS — C787 Secondary malignant neoplasm of liver and intrahepatic bile duct: Secondary | ICD-10-CM | POA: Diagnosis not present

## 2019-09-25 DIAGNOSIS — Z5111 Encounter for antineoplastic chemotherapy: Secondary | ICD-10-CM | POA: Diagnosis not present

## 2019-09-25 DIAGNOSIS — C20 Malignant neoplasm of rectum: Secondary | ICD-10-CM | POA: Diagnosis not present

## 2019-09-29 DIAGNOSIS — R42 Dizziness and giddiness: Secondary | ICD-10-CM | POA: Diagnosis not present

## 2019-09-29 DIAGNOSIS — I10 Essential (primary) hypertension: Secondary | ICD-10-CM | POA: Diagnosis not present

## 2019-09-29 DIAGNOSIS — E782 Mixed hyperlipidemia: Secondary | ICD-10-CM | POA: Diagnosis not present

## 2019-09-29 DIAGNOSIS — I951 Orthostatic hypotension: Secondary | ICD-10-CM | POA: Diagnosis not present

## 2019-09-29 DIAGNOSIS — H6990 Unspecified Eustachian tube disorder, unspecified ear: Secondary | ICD-10-CM | POA: Diagnosis not present

## 2019-09-29 DIAGNOSIS — N401 Enlarged prostate with lower urinary tract symptoms: Secondary | ICD-10-CM | POA: Diagnosis not present

## 2019-09-29 DIAGNOSIS — C787 Secondary malignant neoplasm of liver and intrahepatic bile duct: Secondary | ICD-10-CM | POA: Diagnosis not present

## 2019-09-29 DIAGNOSIS — G629 Polyneuropathy, unspecified: Secondary | ICD-10-CM | POA: Diagnosis not present

## 2019-09-29 DIAGNOSIS — R972 Elevated prostate specific antigen [PSA]: Secondary | ICD-10-CM | POA: Diagnosis not present

## 2019-09-29 DIAGNOSIS — E119 Type 2 diabetes mellitus without complications: Secondary | ICD-10-CM | POA: Diagnosis not present

## 2019-09-29 DIAGNOSIS — D509 Iron deficiency anemia, unspecified: Secondary | ICD-10-CM | POA: Diagnosis not present

## 2019-09-29 DIAGNOSIS — E790 Hyperuricemia without signs of inflammatory arthritis and tophaceous disease: Secondary | ICD-10-CM | POA: Diagnosis not present

## 2019-10-01 ENCOUNTER — Encounter (HOSPITAL_COMMUNITY): Payer: Self-pay | Admitting: Hematology and Oncology

## 2019-10-03 DIAGNOSIS — C801 Malignant (primary) neoplasm, unspecified: Secondary | ICD-10-CM | POA: Diagnosis not present

## 2019-10-03 DIAGNOSIS — C20 Malignant neoplasm of rectum: Secondary | ICD-10-CM | POA: Diagnosis not present

## 2019-10-07 ENCOUNTER — Encounter (HOSPITAL_COMMUNITY): Payer: Self-pay | Admitting: Hematology and Oncology

## 2019-10-07 DIAGNOSIS — K625 Hemorrhage of anus and rectum: Secondary | ICD-10-CM | POA: Diagnosis not present

## 2019-10-14 DIAGNOSIS — C20 Malignant neoplasm of rectum: Secondary | ICD-10-CM | POA: Diagnosis not present

## 2019-10-14 DIAGNOSIS — Z882 Allergy status to sulfonamides status: Secondary | ICD-10-CM | POA: Diagnosis not present

## 2019-10-14 DIAGNOSIS — Z7982 Long term (current) use of aspirin: Secondary | ICD-10-CM | POA: Diagnosis not present

## 2019-10-14 DIAGNOSIS — Z803 Family history of malignant neoplasm of breast: Secondary | ICD-10-CM | POA: Diagnosis not present

## 2019-10-14 DIAGNOSIS — Z833 Family history of diabetes mellitus: Secondary | ICD-10-CM | POA: Diagnosis not present

## 2019-10-14 DIAGNOSIS — G893 Neoplasm related pain (acute) (chronic): Secondary | ICD-10-CM | POA: Diagnosis not present

## 2019-10-14 DIAGNOSIS — Z5111 Encounter for antineoplastic chemotherapy: Secondary | ICD-10-CM | POA: Diagnosis not present

## 2019-10-14 DIAGNOSIS — Z79899 Other long term (current) drug therapy: Secondary | ICD-10-CM | POA: Diagnosis not present

## 2019-10-14 DIAGNOSIS — C787 Secondary malignant neoplasm of liver and intrahepatic bile duct: Secondary | ICD-10-CM | POA: Diagnosis not present

## 2019-10-14 DIAGNOSIS — K625 Hemorrhage of anus and rectum: Secondary | ICD-10-CM | POA: Diagnosis not present

## 2019-10-14 DIAGNOSIS — C801 Malignant (primary) neoplasm, unspecified: Secondary | ICD-10-CM | POA: Diagnosis not present

## 2019-10-14 DIAGNOSIS — Z8249 Family history of ischemic heart disease and other diseases of the circulatory system: Secondary | ICD-10-CM | POA: Diagnosis not present

## 2019-10-15 DIAGNOSIS — C787 Secondary malignant neoplasm of liver and intrahepatic bile duct: Secondary | ICD-10-CM | POA: Diagnosis not present

## 2019-10-15 DIAGNOSIS — C20 Malignant neoplasm of rectum: Secondary | ICD-10-CM | POA: Diagnosis not present

## 2019-10-15 DIAGNOSIS — Z5111 Encounter for antineoplastic chemotherapy: Secondary | ICD-10-CM | POA: Diagnosis not present

## 2019-10-16 DIAGNOSIS — Z5111 Encounter for antineoplastic chemotherapy: Secondary | ICD-10-CM | POA: Diagnosis not present

## 2019-10-16 DIAGNOSIS — C787 Secondary malignant neoplasm of liver and intrahepatic bile duct: Secondary | ICD-10-CM | POA: Diagnosis not present

## 2019-10-16 DIAGNOSIS — C20 Malignant neoplasm of rectum: Secondary | ICD-10-CM | POA: Diagnosis not present

## 2019-10-17 DIAGNOSIS — I10 Essential (primary) hypertension: Secondary | ICD-10-CM | POA: Diagnosis not present

## 2019-10-17 DIAGNOSIS — C787 Secondary malignant neoplasm of liver and intrahepatic bile duct: Secondary | ICD-10-CM | POA: Diagnosis not present

## 2019-10-17 DIAGNOSIS — E7849 Other hyperlipidemia: Secondary | ICD-10-CM | POA: Diagnosis not present

## 2019-10-17 DIAGNOSIS — D649 Anemia, unspecified: Secondary | ICD-10-CM | POA: Diagnosis not present

## 2019-10-17 DIAGNOSIS — E1169 Type 2 diabetes mellitus with other specified complication: Secondary | ICD-10-CM | POA: Diagnosis not present

## 2019-10-17 DIAGNOSIS — D509 Iron deficiency anemia, unspecified: Secondary | ICD-10-CM | POA: Diagnosis not present

## 2019-10-17 DIAGNOSIS — I951 Orthostatic hypotension: Secondary | ICD-10-CM | POA: Diagnosis not present

## 2019-10-17 DIAGNOSIS — C20 Malignant neoplasm of rectum: Secondary | ICD-10-CM | POA: Diagnosis not present

## 2019-11-04 DIAGNOSIS — C787 Secondary malignant neoplasm of liver and intrahepatic bile duct: Secondary | ICD-10-CM | POA: Diagnosis not present

## 2019-11-04 DIAGNOSIS — C20 Malignant neoplasm of rectum: Secondary | ICD-10-CM | POA: Diagnosis not present

## 2019-11-04 DIAGNOSIS — D3A8 Other benign neuroendocrine tumors: Secondary | ICD-10-CM | POA: Diagnosis not present

## 2019-11-04 DIAGNOSIS — Z7982 Long term (current) use of aspirin: Secondary | ICD-10-CM | POA: Diagnosis not present

## 2019-11-04 DIAGNOSIS — Z803 Family history of malignant neoplasm of breast: Secondary | ICD-10-CM | POA: Diagnosis not present

## 2019-11-04 DIAGNOSIS — K6289 Other specified diseases of anus and rectum: Secondary | ICD-10-CM | POA: Diagnosis not present

## 2019-11-04 DIAGNOSIS — Z79899 Other long term (current) drug therapy: Secondary | ICD-10-CM | POA: Diagnosis not present

## 2019-11-04 DIAGNOSIS — K625 Hemorrhage of anus and rectum: Secondary | ICD-10-CM | POA: Diagnosis not present

## 2019-11-04 DIAGNOSIS — C801 Malignant (primary) neoplasm, unspecified: Secondary | ICD-10-CM | POA: Diagnosis not present

## 2019-11-05 DIAGNOSIS — Z5111 Encounter for antineoplastic chemotherapy: Secondary | ICD-10-CM | POA: Diagnosis not present

## 2019-11-05 DIAGNOSIS — C787 Secondary malignant neoplasm of liver and intrahepatic bile duct: Secondary | ICD-10-CM | POA: Diagnosis not present

## 2019-11-05 DIAGNOSIS — Z79899 Other long term (current) drug therapy: Secondary | ICD-10-CM | POA: Diagnosis not present

## 2019-11-05 DIAGNOSIS — C20 Malignant neoplasm of rectum: Secondary | ICD-10-CM | POA: Diagnosis not present

## 2019-11-06 DIAGNOSIS — Z5111 Encounter for antineoplastic chemotherapy: Secondary | ICD-10-CM | POA: Diagnosis not present

## 2019-11-06 DIAGNOSIS — C787 Secondary malignant neoplasm of liver and intrahepatic bile duct: Secondary | ICD-10-CM | POA: Diagnosis not present

## 2019-11-06 DIAGNOSIS — C20 Malignant neoplasm of rectum: Secondary | ICD-10-CM | POA: Diagnosis not present

## 2019-11-13 DIAGNOSIS — C787 Secondary malignant neoplasm of liver and intrahepatic bile duct: Secondary | ICD-10-CM | POA: Diagnosis not present

## 2019-11-13 DIAGNOSIS — D509 Iron deficiency anemia, unspecified: Secondary | ICD-10-CM | POA: Diagnosis not present

## 2019-11-13 DIAGNOSIS — I10 Essential (primary) hypertension: Secondary | ICD-10-CM | POA: Diagnosis not present

## 2019-11-13 DIAGNOSIS — C20 Malignant neoplasm of rectum: Secondary | ICD-10-CM | POA: Diagnosis not present

## 2019-11-13 DIAGNOSIS — I951 Orthostatic hypotension: Secondary | ICD-10-CM | POA: Diagnosis not present

## 2019-11-13 DIAGNOSIS — R634 Abnormal weight loss: Secondary | ICD-10-CM | POA: Diagnosis not present

## 2019-11-21 DIAGNOSIS — I10 Essential (primary) hypertension: Secondary | ICD-10-CM | POA: Diagnosis not present

## 2019-11-21 DIAGNOSIS — R634 Abnormal weight loss: Secondary | ICD-10-CM | POA: Diagnosis not present

## 2019-11-21 DIAGNOSIS — C787 Secondary malignant neoplasm of liver and intrahepatic bile duct: Secondary | ICD-10-CM | POA: Diagnosis not present

## 2019-11-21 DIAGNOSIS — I951 Orthostatic hypotension: Secondary | ICD-10-CM | POA: Diagnosis not present

## 2019-11-21 DIAGNOSIS — D509 Iron deficiency anemia, unspecified: Secondary | ICD-10-CM | POA: Diagnosis not present

## 2019-11-21 DIAGNOSIS — C20 Malignant neoplasm of rectum: Secondary | ICD-10-CM | POA: Diagnosis not present

## 2019-11-24 DIAGNOSIS — R918 Other nonspecific abnormal finding of lung field: Secondary | ICD-10-CM | POA: Diagnosis not present

## 2019-11-24 DIAGNOSIS — D3A8 Other benign neuroendocrine tumors: Secondary | ICD-10-CM | POA: Diagnosis not present

## 2019-11-24 DIAGNOSIS — K6289 Other specified diseases of anus and rectum: Secondary | ICD-10-CM | POA: Diagnosis not present

## 2019-11-24 DIAGNOSIS — C801 Malignant (primary) neoplasm, unspecified: Secondary | ICD-10-CM | POA: Diagnosis not present

## 2019-11-24 DIAGNOSIS — K7689 Other specified diseases of liver: Secondary | ICD-10-CM | POA: Diagnosis not present

## 2019-11-25 DIAGNOSIS — C787 Secondary malignant neoplasm of liver and intrahepatic bile duct: Secondary | ICD-10-CM | POA: Diagnosis not present

## 2019-11-25 DIAGNOSIS — K6289 Other specified diseases of anus and rectum: Secondary | ICD-10-CM | POA: Diagnosis not present

## 2019-11-25 DIAGNOSIS — Z7982 Long term (current) use of aspirin: Secondary | ICD-10-CM | POA: Diagnosis not present

## 2019-11-25 DIAGNOSIS — C801 Malignant (primary) neoplasm, unspecified: Secondary | ICD-10-CM | POA: Diagnosis not present

## 2019-11-25 DIAGNOSIS — Z79899 Other long term (current) drug therapy: Secondary | ICD-10-CM | POA: Diagnosis not present

## 2019-11-25 DIAGNOSIS — C20 Malignant neoplasm of rectum: Secondary | ICD-10-CM | POA: Diagnosis not present

## 2019-11-25 DIAGNOSIS — K625 Hemorrhage of anus and rectum: Secondary | ICD-10-CM | POA: Diagnosis not present

## 2019-11-25 DIAGNOSIS — D3A8 Other benign neuroendocrine tumors: Secondary | ICD-10-CM | POA: Diagnosis not present

## 2019-11-25 DIAGNOSIS — Z803 Family history of malignant neoplasm of breast: Secondary | ICD-10-CM | POA: Diagnosis not present

## 2019-11-26 DIAGNOSIS — Z5111 Encounter for antineoplastic chemotherapy: Secondary | ICD-10-CM | POA: Diagnosis not present

## 2019-11-26 DIAGNOSIS — Z79899 Other long term (current) drug therapy: Secondary | ICD-10-CM | POA: Diagnosis not present

## 2019-11-26 DIAGNOSIS — C20 Malignant neoplasm of rectum: Secondary | ICD-10-CM | POA: Diagnosis not present

## 2019-11-26 DIAGNOSIS — C787 Secondary malignant neoplasm of liver and intrahepatic bile duct: Secondary | ICD-10-CM | POA: Diagnosis not present

## 2019-11-27 DIAGNOSIS — C20 Malignant neoplasm of rectum: Secondary | ICD-10-CM | POA: Diagnosis not present

## 2019-11-27 DIAGNOSIS — Z79899 Other long term (current) drug therapy: Secondary | ICD-10-CM | POA: Diagnosis not present

## 2019-11-27 DIAGNOSIS — Z5111 Encounter for antineoplastic chemotherapy: Secondary | ICD-10-CM | POA: Diagnosis not present

## 2019-11-27 DIAGNOSIS — C787 Secondary malignant neoplasm of liver and intrahepatic bile duct: Secondary | ICD-10-CM | POA: Diagnosis not present

## 2019-11-27 DIAGNOSIS — Z23 Encounter for immunization: Secondary | ICD-10-CM | POA: Diagnosis not present

## 2019-12-16 DIAGNOSIS — K625 Hemorrhage of anus and rectum: Secondary | ICD-10-CM | POA: Diagnosis not present

## 2019-12-16 DIAGNOSIS — Z5111 Encounter for antineoplastic chemotherapy: Secondary | ICD-10-CM | POA: Diagnosis not present

## 2019-12-16 DIAGNOSIS — Z803 Family history of malignant neoplasm of breast: Secondary | ICD-10-CM | POA: Diagnosis not present

## 2019-12-16 DIAGNOSIS — D3A8 Other benign neuroendocrine tumors: Secondary | ICD-10-CM | POA: Diagnosis not present

## 2019-12-16 DIAGNOSIS — Z79899 Other long term (current) drug therapy: Secondary | ICD-10-CM | POA: Diagnosis not present

## 2019-12-16 DIAGNOSIS — C787 Secondary malignant neoplasm of liver and intrahepatic bile duct: Secondary | ICD-10-CM | POA: Diagnosis not present

## 2019-12-16 DIAGNOSIS — K5900 Constipation, unspecified: Secondary | ICD-10-CM | POA: Diagnosis not present

## 2019-12-16 DIAGNOSIS — C20 Malignant neoplasm of rectum: Secondary | ICD-10-CM | POA: Diagnosis not present

## 2019-12-16 DIAGNOSIS — Z7982 Long term (current) use of aspirin: Secondary | ICD-10-CM | POA: Diagnosis not present

## 2019-12-17 DIAGNOSIS — C787 Secondary malignant neoplasm of liver and intrahepatic bile duct: Secondary | ICD-10-CM | POA: Diagnosis not present

## 2019-12-17 DIAGNOSIS — Z5111 Encounter for antineoplastic chemotherapy: Secondary | ICD-10-CM | POA: Diagnosis not present

## 2019-12-17 DIAGNOSIS — C20 Malignant neoplasm of rectum: Secondary | ICD-10-CM | POA: Diagnosis not present

## 2019-12-17 DIAGNOSIS — Z79899 Other long term (current) drug therapy: Secondary | ICD-10-CM | POA: Diagnosis not present

## 2019-12-18 DIAGNOSIS — C20 Malignant neoplasm of rectum: Secondary | ICD-10-CM | POA: Diagnosis not present

## 2019-12-18 DIAGNOSIS — C787 Secondary malignant neoplasm of liver and intrahepatic bile duct: Secondary | ICD-10-CM | POA: Diagnosis not present

## 2019-12-18 DIAGNOSIS — Z79899 Other long term (current) drug therapy: Secondary | ICD-10-CM | POA: Diagnosis not present

## 2019-12-18 DIAGNOSIS — Z5111 Encounter for antineoplastic chemotherapy: Secondary | ICD-10-CM | POA: Diagnosis not present

## 2020-01-02 DIAGNOSIS — Z23 Encounter for immunization: Secondary | ICD-10-CM | POA: Diagnosis not present

## 2020-01-02 LAB — SURGICAL PATHOLOGY

## 2020-01-06 DIAGNOSIS — C801 Malignant (primary) neoplasm, unspecified: Secondary | ICD-10-CM | POA: Diagnosis not present

## 2020-01-06 DIAGNOSIS — C20 Malignant neoplasm of rectum: Secondary | ICD-10-CM | POA: Diagnosis not present

## 2020-01-06 DIAGNOSIS — C787 Secondary malignant neoplasm of liver and intrahepatic bile duct: Secondary | ICD-10-CM | POA: Diagnosis not present

## 2020-01-06 DIAGNOSIS — K6289 Other specified diseases of anus and rectum: Secondary | ICD-10-CM | POA: Diagnosis not present

## 2020-01-06 DIAGNOSIS — D3A8 Other benign neuroendocrine tumors: Secondary | ICD-10-CM | POA: Diagnosis not present

## 2020-01-06 DIAGNOSIS — Z7982 Long term (current) use of aspirin: Secondary | ICD-10-CM | POA: Diagnosis not present

## 2020-01-06 DIAGNOSIS — K625 Hemorrhage of anus and rectum: Secondary | ICD-10-CM | POA: Diagnosis not present

## 2020-01-06 DIAGNOSIS — Z803 Family history of malignant neoplasm of breast: Secondary | ICD-10-CM | POA: Diagnosis not present

## 2020-01-06 DIAGNOSIS — Z79899 Other long term (current) drug therapy: Secondary | ICD-10-CM | POA: Diagnosis not present

## 2020-01-07 DIAGNOSIS — C20 Malignant neoplasm of rectum: Secondary | ICD-10-CM | POA: Diagnosis not present

## 2020-01-07 DIAGNOSIS — Z5111 Encounter for antineoplastic chemotherapy: Secondary | ICD-10-CM | POA: Diagnosis not present

## 2020-01-07 DIAGNOSIS — C787 Secondary malignant neoplasm of liver and intrahepatic bile duct: Secondary | ICD-10-CM | POA: Diagnosis not present

## 2020-01-07 DIAGNOSIS — Z79899 Other long term (current) drug therapy: Secondary | ICD-10-CM | POA: Diagnosis not present

## 2020-01-08 DIAGNOSIS — C787 Secondary malignant neoplasm of liver and intrahepatic bile duct: Secondary | ICD-10-CM | POA: Diagnosis not present

## 2020-01-08 DIAGNOSIS — Z79899 Other long term (current) drug therapy: Secondary | ICD-10-CM | POA: Diagnosis not present

## 2020-01-08 DIAGNOSIS — Z5111 Encounter for antineoplastic chemotherapy: Secondary | ICD-10-CM | POA: Diagnosis not present

## 2020-01-08 DIAGNOSIS — C20 Malignant neoplasm of rectum: Secondary | ICD-10-CM | POA: Diagnosis not present

## 2020-01-19 DIAGNOSIS — E782 Mixed hyperlipidemia: Secondary | ICD-10-CM | POA: Diagnosis not present

## 2020-01-19 DIAGNOSIS — H6990 Unspecified Eustachian tube disorder, unspecified ear: Secondary | ICD-10-CM | POA: Diagnosis not present

## 2020-01-19 DIAGNOSIS — N401 Enlarged prostate with lower urinary tract symptoms: Secondary | ICD-10-CM | POA: Diagnosis not present

## 2020-01-19 DIAGNOSIS — R42 Dizziness and giddiness: Secondary | ICD-10-CM | POA: Diagnosis not present

## 2020-01-19 DIAGNOSIS — D509 Iron deficiency anemia, unspecified: Secondary | ICD-10-CM | POA: Diagnosis not present

## 2020-01-19 DIAGNOSIS — E790 Hyperuricemia without signs of inflammatory arthritis and tophaceous disease: Secondary | ICD-10-CM | POA: Diagnosis not present

## 2020-01-19 DIAGNOSIS — R972 Elevated prostate specific antigen [PSA]: Secondary | ICD-10-CM | POA: Diagnosis not present

## 2020-01-19 DIAGNOSIS — C787 Secondary malignant neoplasm of liver and intrahepatic bile duct: Secondary | ICD-10-CM | POA: Diagnosis not present

## 2020-01-19 DIAGNOSIS — G629 Polyneuropathy, unspecified: Secondary | ICD-10-CM | POA: Diagnosis not present

## 2020-01-19 DIAGNOSIS — I10 Essential (primary) hypertension: Secondary | ICD-10-CM | POA: Diagnosis not present

## 2020-01-19 DIAGNOSIS — E119 Type 2 diabetes mellitus without complications: Secondary | ICD-10-CM | POA: Diagnosis not present

## 2020-01-19 DIAGNOSIS — I951 Orthostatic hypotension: Secondary | ICD-10-CM | POA: Diagnosis not present

## 2020-01-22 DIAGNOSIS — E790 Hyperuricemia without signs of inflammatory arthritis and tophaceous disease: Secondary | ICD-10-CM | POA: Diagnosis not present

## 2020-01-22 DIAGNOSIS — E782 Mixed hyperlipidemia: Secondary | ICD-10-CM | POA: Diagnosis not present

## 2020-01-22 DIAGNOSIS — D530 Protein deficiency anemia: Secondary | ICD-10-CM | POA: Diagnosis not present

## 2020-01-22 DIAGNOSIS — K52831 Collagenous colitis: Secondary | ICD-10-CM | POA: Diagnosis not present

## 2020-01-22 DIAGNOSIS — R972 Elevated prostate specific antigen [PSA]: Secondary | ICD-10-CM | POA: Diagnosis not present

## 2020-01-22 DIAGNOSIS — I10 Essential (primary) hypertension: Secondary | ICD-10-CM | POA: Diagnosis not present

## 2020-01-22 DIAGNOSIS — D696 Thrombocytopenia, unspecified: Secondary | ICD-10-CM | POA: Diagnosis not present

## 2020-01-22 DIAGNOSIS — E876 Hypokalemia: Secondary | ICD-10-CM | POA: Diagnosis not present

## 2020-01-22 DIAGNOSIS — D649 Anemia, unspecified: Secondary | ICD-10-CM | POA: Diagnosis not present

## 2020-01-22 DIAGNOSIS — N400 Enlarged prostate without lower urinary tract symptoms: Secondary | ICD-10-CM | POA: Diagnosis not present

## 2020-01-22 DIAGNOSIS — Z0001 Encounter for general adult medical examination with abnormal findings: Secondary | ICD-10-CM | POA: Diagnosis not present

## 2020-01-22 DIAGNOSIS — G9009 Other idiopathic peripheral autonomic neuropathy: Secondary | ICD-10-CM | POA: Diagnosis not present

## 2020-01-22 DIAGNOSIS — E1169 Type 2 diabetes mellitus with other specified complication: Secondary | ICD-10-CM | POA: Diagnosis not present

## 2020-01-23 DIAGNOSIS — C787 Secondary malignant neoplasm of liver and intrahepatic bile duct: Secondary | ICD-10-CM | POA: Diagnosis not present

## 2020-01-23 DIAGNOSIS — D3A8 Other benign neuroendocrine tumors: Secondary | ICD-10-CM | POA: Diagnosis not present

## 2020-01-23 DIAGNOSIS — E042 Nontoxic multinodular goiter: Secondary | ICD-10-CM | POA: Diagnosis not present

## 2020-01-23 DIAGNOSIS — C20 Malignant neoplasm of rectum: Secondary | ICD-10-CM | POA: Diagnosis not present

## 2020-01-27 DIAGNOSIS — C801 Malignant (primary) neoplasm, unspecified: Secondary | ICD-10-CM | POA: Diagnosis not present

## 2020-01-27 DIAGNOSIS — K625 Hemorrhage of anus and rectum: Secondary | ICD-10-CM | POA: Diagnosis not present

## 2020-01-27 DIAGNOSIS — K6289 Other specified diseases of anus and rectum: Secondary | ICD-10-CM | POA: Diagnosis not present

## 2020-01-27 DIAGNOSIS — D3A8 Other benign neuroendocrine tumors: Secondary | ICD-10-CM | POA: Diagnosis not present

## 2020-01-27 DIAGNOSIS — C787 Secondary malignant neoplasm of liver and intrahepatic bile duct: Secondary | ICD-10-CM | POA: Diagnosis not present

## 2020-01-27 DIAGNOSIS — C20 Malignant neoplasm of rectum: Secondary | ICD-10-CM | POA: Diagnosis not present

## 2020-02-10 DIAGNOSIS — Z79899 Other long term (current) drug therapy: Secondary | ICD-10-CM | POA: Diagnosis not present

## 2020-02-10 DIAGNOSIS — Z5111 Encounter for antineoplastic chemotherapy: Secondary | ICD-10-CM | POA: Diagnosis not present

## 2020-02-10 DIAGNOSIS — C20 Malignant neoplasm of rectum: Secondary | ICD-10-CM | POA: Diagnosis not present

## 2020-02-10 DIAGNOSIS — C787 Secondary malignant neoplasm of liver and intrahepatic bile duct: Secondary | ICD-10-CM | POA: Diagnosis not present

## 2020-02-20 DIAGNOSIS — R530 Neoplastic (malignant) related fatigue: Secondary | ICD-10-CM | POA: Diagnosis not present

## 2020-02-20 DIAGNOSIS — R5383 Other fatigue: Secondary | ICD-10-CM | POA: Diagnosis not present

## 2020-02-20 DIAGNOSIS — C787 Secondary malignant neoplasm of liver and intrahepatic bile duct: Secondary | ICD-10-CM | POA: Diagnosis not present

## 2020-02-20 DIAGNOSIS — K625 Hemorrhage of anus and rectum: Secondary | ICD-10-CM | POA: Diagnosis not present

## 2020-02-20 DIAGNOSIS — C20 Malignant neoplasm of rectum: Secondary | ICD-10-CM | POA: Diagnosis not present

## 2020-03-02 DIAGNOSIS — R918 Other nonspecific abnormal finding of lung field: Secondary | ICD-10-CM | POA: Diagnosis not present

## 2020-03-02 DIAGNOSIS — C218 Malignant neoplasm of overlapping sites of rectum, anus and anal canal: Secondary | ICD-10-CM | POA: Diagnosis not present

## 2020-03-02 DIAGNOSIS — R609 Edema, unspecified: Secondary | ICD-10-CM | POA: Diagnosis not present

## 2020-03-02 DIAGNOSIS — I358 Other nonrheumatic aortic valve disorders: Secondary | ICD-10-CM | POA: Diagnosis not present

## 2020-03-02 DIAGNOSIS — I10 Essential (primary) hypertension: Secondary | ICD-10-CM | POA: Diagnosis present

## 2020-03-02 DIAGNOSIS — R0902 Hypoxemia: Secondary | ICD-10-CM | POA: Diagnosis not present

## 2020-03-02 DIAGNOSIS — E0500 Thyrotoxicosis with diffuse goiter without thyrotoxic crisis or storm: Secondary | ICD-10-CM | POA: Diagnosis present

## 2020-03-02 DIAGNOSIS — R0602 Shortness of breath: Secondary | ICD-10-CM | POA: Diagnosis not present

## 2020-03-02 DIAGNOSIS — C20 Malignant neoplasm of rectum: Secondary | ICD-10-CM | POA: Diagnosis not present

## 2020-03-02 DIAGNOSIS — I482 Chronic atrial fibrillation, unspecified: Secondary | ICD-10-CM | POA: Diagnosis not present

## 2020-03-02 DIAGNOSIS — J189 Pneumonia, unspecified organism: Secondary | ICD-10-CM | POA: Diagnosis not present

## 2020-03-02 DIAGNOSIS — I214 Non-ST elevation (NSTEMI) myocardial infarction: Secondary | ICD-10-CM | POA: Diagnosis not present

## 2020-03-02 DIAGNOSIS — R29898 Other symptoms and signs involving the musculoskeletal system: Secondary | ICD-10-CM | POA: Diagnosis not present

## 2020-03-02 DIAGNOSIS — E063 Autoimmune thyroiditis: Secondary | ICD-10-CM | POA: Diagnosis present

## 2020-03-02 DIAGNOSIS — Z7189 Other specified counseling: Secondary | ICD-10-CM | POA: Diagnosis not present

## 2020-03-02 DIAGNOSIS — K529 Noninfective gastroenteritis and colitis, unspecified: Secondary | ICD-10-CM | POA: Diagnosis present

## 2020-03-02 DIAGNOSIS — C787 Secondary malignant neoplasm of liver and intrahepatic bile duct: Secondary | ICD-10-CM | POA: Diagnosis present

## 2020-03-02 DIAGNOSIS — K625 Hemorrhage of anus and rectum: Secondary | ICD-10-CM | POA: Diagnosis present

## 2020-03-02 DIAGNOSIS — Z9181 History of falling: Secondary | ICD-10-CM | POA: Diagnosis not present

## 2020-03-02 DIAGNOSIS — N201 Calculus of ureter: Secondary | ICD-10-CM | POA: Diagnosis not present

## 2020-03-02 DIAGNOSIS — Z66 Do not resuscitate: Secondary | ICD-10-CM | POA: Diagnosis present

## 2020-03-02 DIAGNOSIS — K219 Gastro-esophageal reflux disease without esophagitis: Secondary | ICD-10-CM | POA: Diagnosis present

## 2020-03-02 DIAGNOSIS — Z79899 Other long term (current) drug therapy: Secondary | ICD-10-CM | POA: Diagnosis not present

## 2020-03-02 DIAGNOSIS — N400 Enlarged prostate without lower urinary tract symptoms: Secondary | ICD-10-CM | POA: Diagnosis present

## 2020-03-02 DIAGNOSIS — T451X5A Adverse effect of antineoplastic and immunosuppressive drugs, initial encounter: Secondary | ICD-10-CM | POA: Diagnosis present

## 2020-03-02 DIAGNOSIS — R600 Localized edema: Secondary | ICD-10-CM | POA: Diagnosis not present

## 2020-03-02 DIAGNOSIS — I4891 Unspecified atrial fibrillation: Secondary | ICD-10-CM | POA: Diagnosis not present

## 2020-03-02 DIAGNOSIS — Z882 Allergy status to sulfonamides status: Secondary | ICD-10-CM | POA: Diagnosis not present

## 2020-03-02 DIAGNOSIS — Z781 Physical restraint status: Secondary | ICD-10-CM | POA: Diagnosis not present

## 2020-03-02 DIAGNOSIS — E785 Hyperlipidemia, unspecified: Secondary | ICD-10-CM | POA: Diagnosis present

## 2020-03-02 DIAGNOSIS — I480 Paroxysmal atrial fibrillation: Secondary | ICD-10-CM | POA: Diagnosis not present

## 2020-03-02 DIAGNOSIS — E069 Thyroiditis, unspecified: Secondary | ICD-10-CM | POA: Diagnosis not present

## 2020-03-02 DIAGNOSIS — J9811 Atelectasis: Secondary | ICD-10-CM | POA: Diagnosis present

## 2020-03-02 DIAGNOSIS — F05 Delirium due to known physiological condition: Secondary | ICD-10-CM | POA: Diagnosis present

## 2020-03-02 DIAGNOSIS — C801 Malignant (primary) neoplasm, unspecified: Secondary | ICD-10-CM | POA: Diagnosis not present

## 2020-03-02 DIAGNOSIS — I454 Nonspecific intraventricular block: Secondary | ICD-10-CM | POA: Diagnosis not present

## 2020-03-02 DIAGNOSIS — D61818 Other pancytopenia: Secondary | ICD-10-CM | POA: Diagnosis present

## 2020-03-02 DIAGNOSIS — Z743 Need for continuous supervision: Secondary | ICD-10-CM | POA: Diagnosis not present

## 2020-03-02 DIAGNOSIS — R000 Tachycardia, unspecified: Secondary | ICD-10-CM | POA: Diagnosis not present

## 2020-03-02 DIAGNOSIS — Z20822 Contact with and (suspected) exposure to covid-19: Secondary | ICD-10-CM | POA: Diagnosis present

## 2020-03-02 DIAGNOSIS — E064 Drug-induced thyroiditis: Secondary | ICD-10-CM | POA: Diagnosis not present

## 2020-03-02 DIAGNOSIS — Z7982 Long term (current) use of aspirin: Secondary | ICD-10-CM | POA: Diagnosis not present

## 2020-03-02 DIAGNOSIS — Z515 Encounter for palliative care: Secondary | ICD-10-CM | POA: Diagnosis not present

## 2020-03-02 DIAGNOSIS — I249 Acute ischemic heart disease, unspecified: Secondary | ICD-10-CM | POA: Diagnosis not present

## 2020-03-02 DIAGNOSIS — R531 Weakness: Secondary | ICD-10-CM | POA: Diagnosis not present

## 2020-03-02 DIAGNOSIS — E0590 Thyrotoxicosis, unspecified without thyrotoxic crisis or storm: Secondary | ICD-10-CM | POA: Diagnosis not present

## 2020-03-02 DIAGNOSIS — M109 Gout, unspecified: Secondary | ICD-10-CM | POA: Diagnosis present

## 2020-03-02 DIAGNOSIS — I4510 Unspecified right bundle-branch block: Secondary | ICD-10-CM | POA: Diagnosis not present

## 2020-03-02 DIAGNOSIS — J9601 Acute respiratory failure with hypoxia: Secondary | ICD-10-CM | POA: Diagnosis not present

## 2020-03-02 DIAGNOSIS — E119 Type 2 diabetes mellitus without complications: Secondary | ICD-10-CM | POA: Diagnosis present

## 2020-03-04 DIAGNOSIS — M1000 Idiopathic gout, unspecified site: Secondary | ICD-10-CM | POA: Diagnosis not present

## 2020-03-04 DIAGNOSIS — E785 Hyperlipidemia, unspecified: Secondary | ICD-10-CM | POA: Diagnosis not present

## 2020-03-04 DIAGNOSIS — E0500 Thyrotoxicosis with diffuse goiter without thyrotoxic crisis or storm: Secondary | ICD-10-CM | POA: Diagnosis present

## 2020-03-04 DIAGNOSIS — N201 Calculus of ureter: Secondary | ICD-10-CM | POA: Diagnosis not present

## 2020-03-04 DIAGNOSIS — Z743 Need for continuous supervision: Secondary | ICD-10-CM | POA: Diagnosis not present

## 2020-03-04 DIAGNOSIS — I498 Other specified cardiac arrhythmias: Secondary | ICD-10-CM | POA: Diagnosis not present

## 2020-03-04 DIAGNOSIS — Z9181 History of falling: Secondary | ICD-10-CM | POA: Diagnosis not present

## 2020-03-04 DIAGNOSIS — R0602 Shortness of breath: Secondary | ICD-10-CM | POA: Diagnosis not present

## 2020-03-04 DIAGNOSIS — R0902 Hypoxemia: Secondary | ICD-10-CM | POA: Diagnosis not present

## 2020-03-04 DIAGNOSIS — K529 Noninfective gastroenteritis and colitis, unspecified: Secondary | ICD-10-CM | POA: Diagnosis present

## 2020-03-04 DIAGNOSIS — R609 Edema, unspecified: Secondary | ICD-10-CM | POA: Diagnosis not present

## 2020-03-04 DIAGNOSIS — Z20822 Contact with and (suspected) exposure to covid-19: Secondary | ICD-10-CM | POA: Diagnosis present

## 2020-03-04 DIAGNOSIS — E0580 Other thyrotoxicosis without thyrotoxic crisis or storm: Secondary | ICD-10-CM | POA: Diagnosis not present

## 2020-03-04 DIAGNOSIS — I4891 Unspecified atrial fibrillation: Secondary | ICD-10-CM | POA: Diagnosis not present

## 2020-03-04 DIAGNOSIS — R140 Abdominal distension (gaseous): Secondary | ICD-10-CM | POA: Diagnosis not present

## 2020-03-04 DIAGNOSIS — K625 Hemorrhage of anus and rectum: Secondary | ICD-10-CM | POA: Diagnosis present

## 2020-03-04 DIAGNOSIS — M109 Gout, unspecified: Secondary | ICD-10-CM | POA: Diagnosis present

## 2020-03-04 DIAGNOSIS — I10 Essential (primary) hypertension: Secondary | ICD-10-CM | POA: Diagnosis present

## 2020-03-04 DIAGNOSIS — C218 Malignant neoplasm of overlapping sites of rectum, anus and anal canal: Secondary | ICD-10-CM | POA: Diagnosis not present

## 2020-03-04 DIAGNOSIS — Z66 Do not resuscitate: Secondary | ICD-10-CM | POA: Diagnosis present

## 2020-03-04 DIAGNOSIS — I454 Nonspecific intraventricular block: Secondary | ICD-10-CM | POA: Diagnosis not present

## 2020-03-04 DIAGNOSIS — D6959 Other secondary thrombocytopenia: Secondary | ICD-10-CM | POA: Diagnosis not present

## 2020-03-04 DIAGNOSIS — E064 Drug-induced thyroiditis: Secondary | ICD-10-CM | POA: Diagnosis not present

## 2020-03-04 DIAGNOSIS — I358 Other nonrheumatic aortic valve disorders: Secondary | ICD-10-CM | POA: Diagnosis not present

## 2020-03-04 DIAGNOSIS — J189 Pneumonia, unspecified organism: Secondary | ICD-10-CM | POA: Diagnosis not present

## 2020-03-04 DIAGNOSIS — R29898 Other symptoms and signs involving the musculoskeletal system: Secondary | ICD-10-CM | POA: Diagnosis not present

## 2020-03-04 DIAGNOSIS — E0590 Thyrotoxicosis, unspecified without thyrotoxic crisis or storm: Secondary | ICD-10-CM | POA: Diagnosis not present

## 2020-03-04 DIAGNOSIS — E063 Autoimmune thyroiditis: Secondary | ICD-10-CM | POA: Diagnosis present

## 2020-03-04 DIAGNOSIS — J9601 Acute respiratory failure with hypoxia: Secondary | ICD-10-CM | POA: Diagnosis not present

## 2020-03-04 DIAGNOSIS — E069 Thyroiditis, unspecified: Secondary | ICD-10-CM | POA: Diagnosis not present

## 2020-03-04 DIAGNOSIS — C801 Malignant (primary) neoplasm, unspecified: Secondary | ICD-10-CM | POA: Diagnosis not present

## 2020-03-04 DIAGNOSIS — C787 Secondary malignant neoplasm of liver and intrahepatic bile duct: Secondary | ICD-10-CM | POA: Diagnosis present

## 2020-03-04 DIAGNOSIS — C20 Malignant neoplasm of rectum: Secondary | ICD-10-CM | POA: Diagnosis present

## 2020-03-04 DIAGNOSIS — F05 Delirium due to known physiological condition: Secondary | ICD-10-CM | POA: Diagnosis present

## 2020-03-04 DIAGNOSIS — T451X5A Adverse effect of antineoplastic and immunosuppressive drugs, initial encounter: Secondary | ICD-10-CM | POA: Diagnosis present

## 2020-03-04 DIAGNOSIS — I4510 Unspecified right bundle-branch block: Secondary | ICD-10-CM | POA: Diagnosis not present

## 2020-03-04 DIAGNOSIS — Z79899 Other long term (current) drug therapy: Secondary | ICD-10-CM | POA: Diagnosis not present

## 2020-03-04 DIAGNOSIS — R600 Localized edema: Secondary | ICD-10-CM | POA: Diagnosis not present

## 2020-03-04 DIAGNOSIS — K219 Gastro-esophageal reflux disease without esophagitis: Secondary | ICD-10-CM | POA: Diagnosis present

## 2020-03-04 DIAGNOSIS — N400 Enlarged prostate without lower urinary tract symptoms: Secondary | ICD-10-CM | POA: Diagnosis present

## 2020-03-04 DIAGNOSIS — D701 Agranulocytosis secondary to cancer chemotherapy: Secondary | ICD-10-CM | POA: Diagnosis not present

## 2020-03-04 DIAGNOSIS — R531 Weakness: Secondary | ICD-10-CM | POA: Diagnosis not present

## 2020-03-04 DIAGNOSIS — R918 Other nonspecific abnormal finding of lung field: Secondary | ICD-10-CM | POA: Diagnosis not present

## 2020-03-04 DIAGNOSIS — R000 Tachycardia, unspecified: Secondary | ICD-10-CM | POA: Diagnosis not present

## 2020-03-04 DIAGNOSIS — J9811 Atelectasis: Secondary | ICD-10-CM | POA: Diagnosis present

## 2020-03-04 DIAGNOSIS — D61818 Other pancytopenia: Secondary | ICD-10-CM | POA: Diagnosis present

## 2020-03-04 DIAGNOSIS — Z882 Allergy status to sulfonamides status: Secondary | ICD-10-CM | POA: Diagnosis not present

## 2020-03-04 DIAGNOSIS — I249 Acute ischemic heart disease, unspecified: Secondary | ICD-10-CM | POA: Diagnosis not present

## 2020-03-04 DIAGNOSIS — E119 Type 2 diabetes mellitus without complications: Secondary | ICD-10-CM | POA: Diagnosis present

## 2020-03-04 DIAGNOSIS — Z781 Physical restraint status: Secondary | ICD-10-CM | POA: Diagnosis not present

## 2020-03-04 DIAGNOSIS — Z7189 Other specified counseling: Secondary | ICD-10-CM | POA: Diagnosis not present

## 2020-03-04 DIAGNOSIS — Z515 Encounter for palliative care: Secondary | ICD-10-CM | POA: Diagnosis not present

## 2020-03-04 DIAGNOSIS — I480 Paroxysmal atrial fibrillation: Secondary | ICD-10-CM | POA: Diagnosis not present

## 2020-03-04 DIAGNOSIS — D61810 Antineoplastic chemotherapy induced pancytopenia: Secondary | ICD-10-CM | POA: Diagnosis not present

## 2020-03-04 DIAGNOSIS — I482 Chronic atrial fibrillation, unspecified: Secondary | ICD-10-CM | POA: Diagnosis not present

## 2020-03-04 DIAGNOSIS — I214 Non-ST elevation (NSTEMI) myocardial infarction: Secondary | ICD-10-CM | POA: Diagnosis not present

## 2020-03-04 DIAGNOSIS — Z7982 Long term (current) use of aspirin: Secondary | ICD-10-CM | POA: Diagnosis not present

## 2020-03-10 ENCOUNTER — Encounter (HOSPITAL_COMMUNITY): Payer: Self-pay | Admitting: Emergency Medicine

## 2020-03-10 ENCOUNTER — Other Ambulatory Visit: Payer: Self-pay

## 2020-03-10 ENCOUNTER — Emergency Department (HOSPITAL_COMMUNITY): Payer: Medicare Other

## 2020-03-10 ENCOUNTER — Inpatient Hospital Stay (HOSPITAL_COMMUNITY)
Admission: EM | Admit: 2020-03-10 | Discharge: 2020-03-11 | DRG: 281 | Disposition: A | Discharge status: Hospice | Payer: Medicare Other | Attending: Internal Medicine | Admitting: Internal Medicine

## 2020-03-10 ENCOUNTER — Inpatient Hospital Stay
Admission: RE | Admit: 2020-03-10 | Discharge: 2020-03-10 | Disposition: A | Discharge status: Skilled Nursing Facility | Payer: Medicare Other | Source: Ambulatory Visit | Attending: Internal Medicine | Admitting: Internal Medicine

## 2020-03-10 DIAGNOSIS — I480 Paroxysmal atrial fibrillation: Secondary | ICD-10-CM | POA: Diagnosis present

## 2020-03-10 DIAGNOSIS — N400 Enlarged prostate without lower urinary tract symptoms: Secondary | ICD-10-CM | POA: Diagnosis not present

## 2020-03-10 DIAGNOSIS — E785 Hyperlipidemia, unspecified: Secondary | ICD-10-CM | POA: Diagnosis present

## 2020-03-10 DIAGNOSIS — I21A1 Myocardial infarction type 2: Principal | ICD-10-CM | POA: Diagnosis present

## 2020-03-10 DIAGNOSIS — E0590 Thyrotoxicosis, unspecified without thyrotoxic crisis or storm: Secondary | ICD-10-CM | POA: Diagnosis present

## 2020-03-10 DIAGNOSIS — Z66 Do not resuscitate: Secondary | ICD-10-CM | POA: Diagnosis present

## 2020-03-10 DIAGNOSIS — I10 Essential (primary) hypertension: Secondary | ICD-10-CM | POA: Diagnosis present

## 2020-03-10 DIAGNOSIS — R140 Abdominal distension (gaseous): Secondary | ICD-10-CM | POA: Diagnosis not present

## 2020-03-10 DIAGNOSIS — E064 Drug-induced thyroiditis: Secondary | ICD-10-CM | POA: Diagnosis not present

## 2020-03-10 DIAGNOSIS — R0602 Shortness of breath: Secondary | ICD-10-CM | POA: Diagnosis not present

## 2020-03-10 DIAGNOSIS — I249 Acute ischemic heart disease, unspecified: Secondary | ICD-10-CM

## 2020-03-10 DIAGNOSIS — J9601 Acute respiratory failure with hypoxia: Secondary | ICD-10-CM | POA: Diagnosis not present

## 2020-03-10 DIAGNOSIS — M109 Gout, unspecified: Secondary | ICD-10-CM | POA: Diagnosis present

## 2020-03-10 DIAGNOSIS — Z79899 Other long term (current) drug therapy: Secondary | ICD-10-CM | POA: Diagnosis not present

## 2020-03-10 DIAGNOSIS — Z9221 Personal history of antineoplastic chemotherapy: Secondary | ICD-10-CM | POA: Diagnosis not present

## 2020-03-10 DIAGNOSIS — Z20822 Contact with and (suspected) exposure to covid-19: Secondary | ICD-10-CM | POA: Diagnosis present

## 2020-03-10 DIAGNOSIS — E069 Thyroiditis, unspecified: Secondary | ICD-10-CM | POA: Diagnosis not present

## 2020-03-10 DIAGNOSIS — D701 Agranulocytosis secondary to cancer chemotherapy: Secondary | ICD-10-CM | POA: Diagnosis not present

## 2020-03-10 DIAGNOSIS — I452 Bifascicular block: Secondary | ICD-10-CM | POA: Diagnosis present

## 2020-03-10 DIAGNOSIS — C20 Malignant neoplasm of rectum: Secondary | ICD-10-CM | POA: Diagnosis present

## 2020-03-10 DIAGNOSIS — R29898 Other symptoms and signs involving the musculoskeletal system: Secondary | ICD-10-CM | POA: Diagnosis not present

## 2020-03-10 DIAGNOSIS — D61810 Antineoplastic chemotherapy induced pancytopenia: Secondary | ICD-10-CM | POA: Diagnosis not present

## 2020-03-10 DIAGNOSIS — R54 Age-related physical debility: Secondary | ICD-10-CM | POA: Diagnosis present

## 2020-03-10 DIAGNOSIS — C787 Secondary malignant neoplasm of liver and intrahepatic bile duct: Secondary | ICD-10-CM | POA: Diagnosis present

## 2020-03-10 DIAGNOSIS — Z882 Allergy status to sulfonamides status: Secondary | ICD-10-CM

## 2020-03-10 DIAGNOSIS — R0600 Dyspnea, unspecified: Secondary | ICD-10-CM

## 2020-03-10 DIAGNOSIS — Z743 Need for continuous supervision: Secondary | ICD-10-CM | POA: Diagnosis not present

## 2020-03-10 DIAGNOSIS — K529 Noninfective gastroenteritis and colitis, unspecified: Secondary | ICD-10-CM | POA: Diagnosis not present

## 2020-03-10 DIAGNOSIS — Z515 Encounter for palliative care: Secondary | ICD-10-CM | POA: Diagnosis not present

## 2020-03-10 DIAGNOSIS — M1000 Idiopathic gout, unspecified site: Secondary | ICD-10-CM | POA: Diagnosis not present

## 2020-03-10 DIAGNOSIS — Z9181 History of falling: Secondary | ICD-10-CM | POA: Diagnosis not present

## 2020-03-10 DIAGNOSIS — J189 Pneumonia, unspecified organism: Secondary | ICD-10-CM | POA: Diagnosis not present

## 2020-03-10 DIAGNOSIS — I214 Non-ST elevation (NSTEMI) myocardial infarction: Secondary | ICD-10-CM | POA: Diagnosis not present

## 2020-03-10 DIAGNOSIS — R7989 Other specified abnormal findings of blood chemistry: Secondary | ICD-10-CM

## 2020-03-10 DIAGNOSIS — I2510 Atherosclerotic heart disease of native coronary artery without angina pectoris: Secondary | ICD-10-CM | POA: Diagnosis present

## 2020-03-10 DIAGNOSIS — Z7189 Other specified counseling: Secondary | ICD-10-CM | POA: Diagnosis not present

## 2020-03-10 DIAGNOSIS — N201 Calculus of ureter: Secondary | ICD-10-CM | POA: Diagnosis not present

## 2020-03-10 DIAGNOSIS — J9811 Atelectasis: Secondary | ICD-10-CM | POA: Diagnosis present

## 2020-03-10 DIAGNOSIS — D6959 Other secondary thrombocytopenia: Secondary | ICD-10-CM | POA: Diagnosis not present

## 2020-03-10 DIAGNOSIS — Z7982 Long term (current) use of aspirin: Secondary | ICD-10-CM

## 2020-03-10 DIAGNOSIS — R600 Localized edema: Secondary | ICD-10-CM | POA: Diagnosis present

## 2020-03-10 DIAGNOSIS — I498 Other specified cardiac arrhythmias: Secondary | ICD-10-CM | POA: Diagnosis not present

## 2020-03-10 DIAGNOSIS — R778 Other specified abnormalities of plasma proteins: Secondary | ICD-10-CM

## 2020-03-10 DIAGNOSIS — E0580 Other thyrotoxicosis without thyrotoxic crisis or storm: Secondary | ICD-10-CM | POA: Diagnosis not present

## 2020-03-10 DIAGNOSIS — I4891 Unspecified atrial fibrillation: Secondary | ICD-10-CM | POA: Diagnosis not present

## 2020-03-10 HISTORY — DX: Unspecified atrial fibrillation: I48.91

## 2020-03-10 HISTORY — DX: Encephalopathy, unspecified: G93.40

## 2020-03-10 HISTORY — DX: Essential (primary) hypertension: I10

## 2020-03-10 HISTORY — DX: Malignant (primary) neoplasm, unspecified: C80.1

## 2020-03-10 HISTORY — DX: Thyrotoxicosis, unspecified without thyrotoxic crisis or storm: E05.90

## 2020-03-10 LAB — RESP PANEL BY RT-PCR (FLU A&B, COVID) ARPGX2
Influenza A by PCR: NEGATIVE
Influenza B by PCR: NEGATIVE
SARS Coronavirus 2 by RT PCR: NEGATIVE

## 2020-03-10 LAB — CBC
HCT: 38.1 % — ABNORMAL LOW (ref 39.0–52.0)
Hemoglobin: 12.2 g/dL — ABNORMAL LOW (ref 13.0–17.0)
MCH: 32.5 pg (ref 26.0–34.0)
MCHC: 32 g/dL (ref 30.0–36.0)
MCV: 101.6 fL — ABNORMAL HIGH (ref 80.0–100.0)
Platelets: 123 10*3/uL — ABNORMAL LOW (ref 150–400)
RBC: 3.75 MIL/uL — ABNORMAL LOW (ref 4.22–5.81)
RDW: 14.1 % (ref 11.5–15.5)
WBC: 5.5 10*3/uL (ref 4.0–10.5)
nRBC: 0 % (ref 0.0–0.2)

## 2020-03-10 LAB — COMPREHENSIVE METABOLIC PANEL
ALT: 468 U/L — ABNORMAL HIGH (ref 0–44)
AST: 322 U/L — ABNORMAL HIGH (ref 15–41)
Albumin: 3.3 g/dL — ABNORMAL LOW (ref 3.5–5.0)
Alkaline Phosphatase: 65 U/L (ref 38–126)
Anion gap: 10 (ref 5–15)
BUN: 28 mg/dL — ABNORMAL HIGH (ref 8–23)
CO2: 28 mmol/L (ref 22–32)
Calcium: 8.7 mg/dL — ABNORMAL LOW (ref 8.9–10.3)
Chloride: 98 mmol/L (ref 98–111)
Creatinine, Ser: 0.56 mg/dL — ABNORMAL LOW (ref 0.61–1.24)
GFR, Estimated: >60 mL/min (ref >60)
Glucose, Bld: 185 mg/dL — ABNORMAL HIGH (ref 70–99)
Potassium: 4.4 mmol/L (ref 3.5–5.1)
Sodium: 136 mmol/L (ref 135–145)
Total Bilirubin: 0.6 mg/dL (ref 0.3–1.2)
Total Protein: 5.8 g/dL — ABNORMAL LOW (ref 6.5–8.1)

## 2020-03-10 LAB — TROPONIN I (HIGH SENSITIVITY): Troponin I (High Sensitivity): 2701 ng/L (ref <18)

## 2020-03-10 LAB — BRAIN NATRIURETIC PEPTIDE: B Natriuretic Peptide: 61 pg/mL (ref 0.0–100.0)

## 2020-03-10 MED ORDER — IOHEXOL 350 MG/ML SOLN
100.0000 mL | Freq: Once | INTRAVENOUS | Status: AC | PRN
  Administered 2020-03-10: 100 mL via INTRAVENOUS

## 2020-03-10 NOTE — ED Notes (Addendum)

## 2020-03-10 NOTE — ED Notes (Signed)
Patient transported to CT 

## 2020-03-10 NOTE — ED Triage Notes (Signed)
Pt brought over from [facility] for difficulty breathing. Per [facility] staff, pt was received by them from [facility] today for rehab d/t weakness. Staff states that they noticed pt had an increase in his work of breathing and "seemed uncomfortable" so she placed pt on O2 @ 2 L and notified the [facility] Provider caring for pt and was told to send him to ED for evaluation.

## 2020-03-10 NOTE — ED Notes (Signed)
ED provider at bedside.

## 2020-03-10 NOTE — ED Notes (Addendum)
CRITICAL VALUE ALERT  Critical Value:  Troponin 2701 Date & Time Notied:  03/10/20 Provider Notified: Dr Denton Lank Orders Received/Actions taken: None yet

## 2020-03-10 NOTE — ED Provider Notes (Addendum)
[facility] EMERGENCY DEPARTMENT Provider Note   CSN: [id number] Arrival date & time: 03/10/20  2047     History Chief Complaint  Patient presents with  . Shortness of Breath    Nicholas Hines is a 87 y.o. male.  Patient with hx afib, rectal ca w liver mets, presents with sob for the past week. Symptoms gradual onset, moderate, persistent, slowly worse, worse w lying flat and exertion. Denies chest pain or discomfort. No pleuritic symptoms/pain. No cough or uri symptoms. No fever or chills. +bilateral leg edema. Denies hx cad. Denies hx copd/asthma. No hx dvt or pe.   The history is provided by the patient and the EMS personnel.  Shortness of Breath Associated symptoms: no abdominal pain, no chest pain, no cough, no fever, no headaches, no neck pain, no rash, no sore throat and no vomiting        Past Medical History:  Diagnosis Date  . Atrial fibrillation with rapid ventricular response ([place])   . Cancer ([place])    Rectal CA with mets to liver  . Encephalopathy acute   . Hyperlipidemia   . Hypertension   . Thyrotoxicosis     Patient Active Problem List   Diagnosis Date Noted  . Liver lesion, right lobe 09/02/2019  . Rectal bleeding 08/30/2019  . HLD (hyperlipidemia) 08/30/2019  . HTN (hypertension) 08/30/2019  . BPH (benign prostatic hyperplasia) 08/30/2019  . Gout 08/30/2019  . Allergic rhinitis 08/30/2019  . GERD (gastroesophageal reflux disease) 08/30/2019    Past Surgical History:  Procedure Laterality Date  . BIOPSY  08/31/2019   Procedure: BIOPSY;  Surgeon: Otis Brace, MD;  Location: [place];  Service: Gastroenterology;;  . Otho Darner SIGMOIDOSCOPY N/A 08/31/2019   Procedure: FLEXIBLE SIGMOIDOSCOPY;  Surgeon: Otis Brace, MD;  Location: [place];  Service: Gastroenterology;  Laterality: N/A;       History reviewed. No pertinent family history.  Social History   Tobacco Use  . Smoking status: Never Smoker  . Smokeless tobacco: Never  Used  Substance Use Topics  . Alcohol use: Never  . Drug use: Never    Home Medications Prior to Admission medications   Medication Sig Start Date End Date Taking? Authorizing Provider  allopurinol (ZYLOPRIM) 100 MG tablet Take 100 mg by mouth daily.    [provider]  cholecalciferol (VITAMIN D3) 25 MCG (1000 UNIT) tablet Take 1,000 Units by mouth daily.    [provider]  docusate sodium (COLACE) 100 MG capsule Take 1 capsule (100 mg total) by mouth 2 (two) times daily. 09/07/19   Geradine Girt, DO  doxazosin (CARDURA) 4 MG tablet Take 1 tablet (4 mg total) by mouth at bedtime. 09/07/19   Geradine Girt, DO  esomeprazole (NEXIUM) 40 MG capsule Take 40 mg by mouth daily at 12 noon.    [provider]  finasteride (PROSCAR) 5 MG tablet Take 5 mg by mouth daily.    [provider]  hydrocortisone (ANUSOL-HC) 25 MG suppository Place 1 suppository (25 mg total) rectally 2 (two) times daily. 09/07/19   Geradine Girt, DO  Lactobacillus (ACIDOPHILUS PROBIOTIC PO) Take 1 tablet by mouth daily.    [provider]  levocetirizine (XYZAL) 5 MG tablet Take 5 mg by mouth every evening.    [provider]  lidocaine (XYLOCAINE) 5 % ointment Apply topically 2 (two) times daily. 09/07/19   Geradine Girt, DO  midodrine (PROAMATINE) 5 MG tablet Take 1 tablet (5 mg total) by mouth  3 (three) times daily with meals. 09/07/19   Joseph Art, DO  Multiple Vitamins-Minerals (CENTRUM SILVER 50+MEN PO) Take 1 tablet by mouth daily.     [provider]  polyethylene glycol (MIRALAX / GLYCOLAX) 17 g packet Take 17 g by mouth daily as needed for mild constipation or moderate constipation.     [provider]  potassium citrate (UROCIT-K) 10 MEQ (1080 MG) SR tablet Take 20 mEq by mouth in the morning and at bedtime.     [provider]  Simethicone (GAS-X PO) Take 1 tablet by mouth 3 (three) times daily as needed (for bloating).     [provider]  simvastatin (ZOCOR) 40 MG tablet Take 40 mg by mouth daily.    [provider]  Wheat Dextrin (BENEFIBER PO) Take by mouth See admin instructions. Mix 2 teaspoonfuls with coffee and drink every morning    [provider]    Allergies    Sulfa antibiotics  Review of Systems   Review of Systems  Constitutional: Negative for fever.  HENT: Negative for sore throat.   Eyes: Negative for redness.  Respiratory: Positive for shortness of breath. Negative for cough.   Cardiovascular: Positive for leg swelling. Negative for chest pain and palpitations.  Gastrointestinal: Negative for abdominal pain, diarrhea and vomiting.  Genitourinary: Negative for dysuria and flank pain.  Musculoskeletal: Negative for back pain and neck pain.  Skin: Negative for rash.  Neurological: Negative for headaches.  Hematological: Does not bruise/bleed easily.  Psychiatric/Behavioral: Negative for confusion.    Physical Exam Updated Vital Signs BP (!) 158/95 (BP Location: Left Arm)   Pulse 97   Temp 98.1 F (36.7 C) (Oral)   Resp (!) 24   Ht 1.829 m (6')   Wt 88 kg   SpO2 95%   BMI 26.31 kg/m   Physical Exam Vitals and nursing note reviewed.  Constitutional:      Appearance: Normal appearance. He is well-developed.  HENT:     Head: Atraumatic.     Nose: Nose normal.     Mouth/Throat:     Mouth: Mucous membranes are moist.     Pharynx: Oropharynx is clear.  Eyes:     General: No scleral icterus.    Conjunctiva/sclera: Conjunctivae normal.  Neck:     Trachea: No tracheal deviation.  Cardiovascular:     Rate and Rhythm: Normal rate and regular rhythm.     Pulses: Normal pulses.     Heart sounds: Normal heart sounds. No murmur heard. No friction rub. No gallop.   Pulmonary:     Effort: Pulmonary effort is normal. No accessory muscle usage or respiratory distress.     Breath sounds: Normal breath sounds.  Abdominal:     General: Bowel sounds are  normal. There is no distension.     Palpations: Abdomen is soft.     Tenderness: There is no abdominal tenderness. There is no guarding.  Genitourinary:    Comments: No cva tenderness. Musculoskeletal:        General: Swelling present. No tenderness.     Cervical back: Normal range of motion and neck supple. No rigidity.     Right lower leg: Edema present.     Left lower leg: Edema present.     Comments: Symmetric bil lower leg/ankle edema.   Skin:    General: Skin is warm and dry.     Findings: No rash.  Neurological:     Mental Status: He is alert.  Comments: Alert, speech clear.   Psychiatric:        Mood and Affect: Mood normal.      ED Results / Procedures / Treatments   Labs (all labs ordered are listed, but only abnormal results are displayed) Results for orders placed or performed during the hospital encounter of 03/10/20  Resp Panel by RT-PCR (Flu A&B, Covid) Nasopharyngeal Swab   Specimen: Nasopharyngeal Swab; Nasopharyngeal(NP) swabs in vial transport medium  Result Value Ref Range   SARS Coronavirus 2 by RT PCR NEGATIVE NEGATIVE   Influenza A by PCR NEGATIVE NEGATIVE   Influenza B by PCR NEGATIVE NEGATIVE  CBC  Result Value Ref Range   WBC 5.5 4.0 - 10.5 K/uL   RBC 3.75 (L) 4.22 - 5.81 MIL/uL   Hemoglobin 12.2 (L) 13.0 - 17.0 g/dL   HCT 38.1 (L) 39.0 - 52.0 %   MCV 101.6 (H) 80.0 - 100.0 fL   MCH 32.5 26.0 - 34.0 pg   MCHC 32.0 30.0 - 36.0 g/dL   RDW 14.1 11.5 - 15.5 %   Platelets 123 (L) 150 - 400 K/uL   nRBC 0.0 0.0 - 0.2 %  Comprehensive metabolic panel  Result Value Ref Range   Sodium 136 135 - 145 mmol/L   Potassium 4.4 3.5 - 5.1 mmol/L   Chloride 98 98 - 111 mmol/L   CO2 28 22 - 32 mmol/L   Glucose, Bld 185 (H) 70 - 99 mg/dL   BUN 28 (H) 8 - 23 mg/dL   Creatinine, Ser 0.56 (L) 0.61 - 1.24 mg/dL   Calcium 8.7 (L) 8.9 - 10.3 mg/dL   Total Protein 5.8 (L) 6.5 - 8.1 g/dL   Albumin 3.3 (L) 3.5 - 5.0 g/dL   AST 322 (H) 15 - 41 U/L   ALT 468 (H)  0 - 44 U/L   Alkaline Phosphatase 65 38 - 126 U/L   Total Bilirubin 0.6 0.3 - 1.2 mg/dL   GFR, Estimated >60 >60 mL/min   Anion gap 10 5 - 15  Brain natriuretic peptide  Result Value Ref Range   B Natriuretic Peptide 61.0 0.0 - 100.0 pg/mL  Troponin I (High Sensitivity)  Result Value Ref Range   Troponin I (High Sensitivity) 2,701 (HH) <18 ng/L   EKG EKG Interpretation  Date/Time:  Wednesday March 10 2020 20:52:28 EST Ventricular Rate:  89 PR Interval:    QRS Duration: 161 QT Interval:  376 QTC Calculation: 458 R Axis:   -36 Text Interpretation: Sinus rhythm Right bundle branch block LVH by voltage Confirmed by Lajean Saver [phone]) on 03/10/2020 9:35:33 PM   Radiology DG Chest Port 1 View  Result Date: 03/10/2020 CLINICAL DATA:  Short of breath EXAM: PORTABLE CHEST 1 VIEW COMPARISON:  None. FINDINGS: Right-sided central venous port tip over the right atrium. Low lung volumes. Mild consolidation at the left base. No pleural effusion. Normal heart size. No pneumothorax. IMPRESSION: Low lung volumes with atelectasis or mild pneumonia at the left lung base. Electronically Signed   By: Donavan Foil M.D.   On: 03/10/2020 22:25    Procedures Procedures (including critical care time)  Medications Ordered in ED Medications - No data to display  ED Course  I have reviewed the triage vital signs and the nursing notes.  Pertinent labs & imaging results that were available during my care of the patient were reviewed by me and considered in my medical decision making (see chart for details).    MDM Rules/Calculators/A&P  Iv ns. Continuous pulse ox and cardiac monitoring. Ecg. Cxr. Stat labs.  Reviewed nursing notes and prior charts for additional history.  Pt noted to be DNR/confirmed w pt.   Labs reviewed/interpreted by me - wbc normal.  CXR reviewed/interpreted by me -  ?base infiltrate  MDM Number of Diagnoses or Management Options   Amount and/or  Complexity of Data Reviewed Clinical lab tests: ordered and reviewed Tests in the radiology section of CPT: ordered and reviewed Tests in the medicine section of CPT: ordered and reviewed Discussion of test results with the performing providers: yes Decide to obtain previous medical records or to obtain history from someone other than the patient: yes Obtain history from someone other than the patient: yes Review and summarize past medical records: yes Discuss the patient with other providers: yes Independent visualization of images, tracings, or specimens: yes  Risk of Complications, Morbidity, and/or Mortality Presenting problems: high Diagnostic procedures: high Management options: high   Additional labs reviewed/interpreted by me - trop v high. Recheck pt. No chest pain. Denies cp earlier. Repeat ecg. [place]. Hep.   Will get cta r/o pe given hx met ca.   Plan for admission.  CRITICAL CARE RE acute dyspnea, with marked elevated trop, suspect ACS, r/o PE.  Performed by: Mirna Mires Total critical care time: 45 minutes Critical care time was exclusive of separately billable procedures and treating other patients. Critical care was necessary to treat or prevent imminent or life-threatening deterioration. Critical care was time spent personally by me on the following activities: development of treatment plan with patient and/or surrogate as well as nursing, discussions with consultants, evaluation of patient's response to treatment, examination of patient, obtaining history from patient or surrogate, ordering and performing treatments and interventions, ordering and review of laboratory studies, ordering and review of radiographic studies, pulse oximetry and re-evaluation of patient's condition.   CTa neg for PE.  Multiple cardiac calcifications noted.   Additional hx reviewed - recent care everywhere chart from recent admission in [facility] system - CTA pe study for dyspnea during that  admission also neg. TEE with no wall motion abn.  LFTS were high then, and ct abd was done showing progressive metastatic disease - given recent ct abd, feel no need to repeat tonight (and pt with no abd pain or tenderness).   Given markedly elevated trop, calcifications on imaging, suspect ACS - pt continue to have no cp or discomfort, and is resting comfortably now.   Hospitalists re-paged for admission  - await return call.   Cardiology consulted. Discussed pt with cardiologist on call - she indicates no need for emergent cardiology intervention/cath, or transfer, and that from their standpoint can medically manage at [facility] and consult cardiology rounding team at [facility] in AM.          Final Clinical Impression(s) / ED Diagnoses Final diagnoses:  None    Rx / DC Orders ED Discharge Orders    None           Lajean Saver, MD 03/11/20 0106

## 2020-03-11 ENCOUNTER — Encounter (HOSPITAL_COMMUNITY): Payer: Self-pay | Admitting: Internal Medicine

## 2020-03-11 DIAGNOSIS — J9811 Atelectasis: Secondary | ICD-10-CM | POA: Diagnosis present

## 2020-03-11 DIAGNOSIS — Z20822 Contact with and (suspected) exposure to covid-19: Secondary | ICD-10-CM | POA: Diagnosis present

## 2020-03-11 DIAGNOSIS — I452 Bifascicular block: Secondary | ICD-10-CM | POA: Diagnosis present

## 2020-03-11 DIAGNOSIS — Z7189 Other specified counseling: Secondary | ICD-10-CM

## 2020-03-11 DIAGNOSIS — I2510 Atherosclerotic heart disease of native coronary artery without angina pectoris: Secondary | ICD-10-CM | POA: Diagnosis present

## 2020-03-11 DIAGNOSIS — Z515 Encounter for palliative care: Secondary | ICD-10-CM | POA: Diagnosis not present

## 2020-03-11 DIAGNOSIS — Z79899 Other long term (current) drug therapy: Secondary | ICD-10-CM | POA: Diagnosis not present

## 2020-03-11 DIAGNOSIS — R54 Age-related physical debility: Secondary | ICD-10-CM | POA: Diagnosis present

## 2020-03-11 DIAGNOSIS — C787 Secondary malignant neoplasm of liver and intrahepatic bile duct: Secondary | ICD-10-CM | POA: Diagnosis not present

## 2020-03-11 DIAGNOSIS — I10 Essential (primary) hypertension: Secondary | ICD-10-CM

## 2020-03-11 DIAGNOSIS — Z66 Do not resuscitate: Secondary | ICD-10-CM | POA: Diagnosis present

## 2020-03-11 DIAGNOSIS — R600 Localized edema: Secondary | ICD-10-CM | POA: Diagnosis present

## 2020-03-11 DIAGNOSIS — M109 Gout, unspecified: Secondary | ICD-10-CM | POA: Diagnosis present

## 2020-03-11 DIAGNOSIS — E0590 Thyrotoxicosis, unspecified without thyrotoxic crisis or storm: Secondary | ICD-10-CM | POA: Diagnosis present

## 2020-03-11 DIAGNOSIS — E785 Hyperlipidemia, unspecified: Secondary | ICD-10-CM | POA: Diagnosis present

## 2020-03-11 DIAGNOSIS — I480 Paroxysmal atrial fibrillation: Secondary | ICD-10-CM | POA: Diagnosis not present

## 2020-03-11 DIAGNOSIS — C20 Malignant neoplasm of rectum: Secondary | ICD-10-CM | POA: Diagnosis not present

## 2020-03-11 DIAGNOSIS — Z743 Need for continuous supervision: Secondary | ICD-10-CM | POA: Diagnosis not present

## 2020-03-11 DIAGNOSIS — I213 ST elevation (STEMI) myocardial infarction of unspecified site: Secondary | ICD-10-CM | POA: Diagnosis not present

## 2020-03-11 DIAGNOSIS — Z7982 Long term (current) use of aspirin: Secondary | ICD-10-CM | POA: Diagnosis not present

## 2020-03-11 DIAGNOSIS — I214 Non-ST elevation (NSTEMI) myocardial infarction: Secondary | ICD-10-CM | POA: Diagnosis not present

## 2020-03-11 DIAGNOSIS — R0600 Dyspnea, unspecified: Secondary | ICD-10-CM | POA: Diagnosis not present

## 2020-03-11 DIAGNOSIS — Z9221 Personal history of antineoplastic chemotherapy: Secondary | ICD-10-CM | POA: Diagnosis not present

## 2020-03-11 DIAGNOSIS — I21A1 Myocardial infarction type 2: Secondary | ICD-10-CM | POA: Diagnosis present

## 2020-03-11 DIAGNOSIS — Z882 Allergy status to sulfonamides status: Secondary | ICD-10-CM | POA: Diagnosis not present

## 2020-03-11 LAB — COMPREHENSIVE METABOLIC PANEL
ALT: 387 U/L — ABNORMAL HIGH (ref 0–44)
AST: 256 U/L — ABNORMAL HIGH (ref 15–41)
Albumin: 2.8 g/dL — ABNORMAL LOW (ref 3.5–5.0)
Alkaline Phosphatase: 50 U/L (ref 38–126)
Anion gap: 8 (ref 5–15)
BUN: 28 mg/dL — ABNORMAL HIGH (ref 8–23)
CO2: 29 mmol/L (ref 22–32)
Calcium: 8.4 mg/dL — ABNORMAL LOW (ref 8.9–10.3)
Chloride: 100 mmol/L (ref 98–111)
Creatinine, Ser: 0.56 mg/dL — ABNORMAL LOW (ref 0.61–1.24)
GFR, Estimated: >60 mL/min (ref >60)
Glucose, Bld: 198 mg/dL — ABNORMAL HIGH (ref 70–99)
Potassium: 4.4 mmol/L (ref 3.5–5.1)
Sodium: 137 mmol/L (ref 135–145)
Total Bilirubin: 0.8 mg/dL (ref 0.3–1.2)
Total Protein: 4.8 g/dL — ABNORMAL LOW (ref 6.5–8.1)

## 2020-03-11 LAB — TROPONIN I (HIGH SENSITIVITY)
Troponin I (High Sensitivity): 2101 ng/L (ref <18)
Troponin I (High Sensitivity): 2315 ng/L (ref <18)
Troponin I (High Sensitivity): 2330 ng/L (ref <18)

## 2020-03-11 LAB — HEPARIN LEVEL (UNFRACTIONATED)
Heparin Unfractionated: 0.36 IU/mL (ref 0.30–0.70)
Heparin Unfractionated: 0.25 IU/mL — ABNORMAL LOW (ref 0.30–0.70)

## 2020-03-11 MED ORDER — HEPARIN (PORCINE) 25000 UT/250ML-% IV SOLN
1350.0000 [IU]/h | INTRAVENOUS | Status: DC
  Administered 2020-03-11: 1200 [IU]/h via INTRAVENOUS
  Filled 2020-03-11: qty 250

## 2020-03-11 MED ORDER — PROPRANOLOL HCL ER 80 MG PO CP24
160.0000 mg | ORAL_CAPSULE | Freq: Every day | ORAL | Status: DC
  Administered 2020-03-11: 160 mg via ORAL
  Filled 2020-03-11 (×2): qty 2

## 2020-03-11 MED ORDER — ASPIRIN 81 MG PO TBEC
81.0000 mg | DELAYED_RELEASE_TABLET | Freq: Every day | ORAL | 11 refills | Status: AC
Start: 2020-03-12 — End: ?

## 2020-03-11 MED ORDER — PROPRANOLOL HCL ER 160 MG PO CP24: 160.0000 mg | ORAL_CAPSULE | Freq: Every day | ORAL | 1 refills | Status: AC

## 2020-03-11 MED ORDER — FUROSEMIDE 10 MG/ML IJ SOLN
40.0000 mg | Freq: Every day | INTRAMUSCULAR | Status: DC
  Administered 2020-03-11: 40 mg via INTRAVENOUS
  Filled 2020-03-11: qty 4

## 2020-03-11 MED ORDER — HEPARIN BOLUS VIA INFUSION
4000.0000 [IU] | Freq: Once | INTRAVENOUS | Status: AC
  Administered 2020-03-11: 4000 [IU] via INTRAVENOUS

## 2020-03-11 MED ORDER — ASPIRIN EC 81 MG PO TBEC
81.0000 mg | DELAYED_RELEASE_TABLET | Freq: Every day | ORAL | Status: DC
  Administered 2020-03-11: 81 mg via ORAL
  Filled 2020-03-11: qty 1

## 2020-03-11 MED ORDER — ASPIRIN 81 MG PO CHEW
324.0000 mg | CHEWABLE_TABLET | Freq: Once | ORAL | Status: AC
  Administered 2020-03-11: 324 mg via ORAL
  Filled 2020-03-11: qty 4

## 2020-03-11 NOTE — Progress Notes (Signed)
ANTICOAGULATION CONSULT NOTE - Initial Consult  Pharmacy Consult for Heparin Indication: chest pain/ACS  Allergies  Allergen Reactions  . Sulfa Antibiotics Rash    Patient Measurements: Height: 6' (182.9 cm) Weight: 88 kg (194 lb 0.1 oz) IBW/kg (Calculated) : 77.6 Heparin Dosing Weight: TBW  Vital Signs: Temp: 98.1 F (36.7 C) (01/05 2057) Temp Source: Oral (01/05 2057) BP: 154/122 (01/05 2330) Pulse Rate: 96 (01/05 2330)  Labs: Recent Labs    03/10/20 2155  HGB 12.2*  HCT 38.1*  PLT 123*  CREATININE 0.56*  TROPONINIHS 2,701*    Estimated Creatinine Clearance: 75.4 mL/min (A) (by C-G formula based on SCr of 0.56 mg/dL (L)).   Medical History: Past Medical History:  Diagnosis Date  . Atrial fibrillation with rapid ventricular response ([facility])   . Cancer ([facility])    Rectal CA with mets to liver  . Encephalopathy acute   . Hyperlipidemia   . Hypertension   . Thyrotoxicosis     Medications:  Awaiting electronic med rec  Assessment: 87 y.o. M presents with CP. To begin heparin for r/o ACS. No AC PTA. Plt slightly low at admission at 123.   Goal of Therapy:  Heparin level 0.3-0.7 units/ml Monitor platelets by anticoagulation protocol: Yes   Plan:  Heparin IV bolus 4000 units Heparin gtt at 1200 units/hr Will f/u heparin level in 8 hours Daily heparin level and CBC  Christoper Fabian, PharmD, BCPS Please see amion for complete clinical pharmacist phone list 03/11/2020,12:18 AM

## 2020-03-11 NOTE — Discharge Summary (Signed)
Discharge Summary  Nicholas Hines SWF:[id number] DOB: 11-25-1933  PCP: Nicholas Stabile, MD  Admit date: 03/10/2020 Discharge date: 03/11/2020  Time spent: 25 minutes  Recommendations for Outpatient Follow-up:  1. Patient being discharged to home hospice 2. New medication: Propranolol 160 mg p.o. daily 3. New medication: Aspirin 81 mg p.o. daily 4. As patient decides, he may elect to discontinue these or other medications as he is on hospice  Discharge Diagnoses:  Active Hospital Problems   Diagnosis Date Noted  . NSTEMI (non-ST elevated myocardial infarction) (HCC) 03/11/2020  . Bilateral lower extremity edema 03/11/2020  . Rectal cancer (HCC) 03/11/2020  . PAF (paroxysmal atrial fibrillation) (HCC) 03/11/2020  . HLD (hyperlipidemia) 08/30/2019  . HTN (hypertension) 08/30/2019    Resolved Hospital Problems  No resolved problems to display.    Discharge Condition: Stable, long-term prognosis limited  Diet recommendation: Heart healthy, this may change to any diet the patient prefers  Vitals:   03/11/20 1130 03/11/20 1500  BP: (!) 160/81 (!) 148/76  Pulse: (!) 110 (!) 108  Resp: (!) 28 18  Temp:    SpO2: 98% 99%    History of present illness:  87 year old male with past medical history of metastatic carcinoma of the rectum, paroxysmal A. fib and hypertension who was residing at the [facility] when he was brought in for worsening dyspnea.  He was also noted to have some lower extremity edema.  In the emergency room, initial troponin was at 2700 and chest x-ray noted some mild atelectasis at left lung base.  EKG with no evidence of acute ischemic changes.  Case was discussed with on-call cardiologist and given patient's advanced age and comorbidities of active cancer, felt that he should be medically managed and not a candidate for cardiac catheterization.     Hospital Course:  Principal Problem:   NSTEMI (non-ST elevated myocardial infarction) [facility]): Seen by cardiology and  placed on propranolol and aspirin.  Not felt to be an interventional candidate.  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  Palliative care consulted.  Social work reach out to patient's family and it was agreed upon that he would go to hospice at home.  Equipment was able to be delivered on evening of 1/6 and patient went home with hospice. Active Problems:   HLD (hyperlipidemia)   HTN (hypertension)   Bilateral lower extremity edema: Noted to have some marginal edema, possibly some mild underlying heart failure.  Further work-up discontinued as patient has elected for hospice care.    Rectal cancer [facility]): Patient indicated that he is tired of treatment and wanted to stop.  Hospice measures set up as above.   PAF (paroxysmal atrial fibrillation) (HCC): Currently in normal sinus rhythm.  As above, going to hospice care.   Procedures:  None   Consultations:  Cardiology  Palliative Care   Discharge Exam: BP (!) 148/76   Pulse (!) 108   Temp 98.1 F (36.7 C) (Oral)   Resp 18   Ht 6' (1.829 m)   Wt 88 kg   SpO2 99%   BMI 26.31 kg/m   General: Alert & oriented, fatigued.  Cardiovascular: Reg rhythm, borderline tachycardia  Respiratory: Clear to auscultation bilaterally   Discharge Instructions You were cared for by a hospitalist during your hospital stay. If you have any questions about your discharge medications or the care  you received while you were in the hospital after you are discharged, you can call the unit and asked to speak with the hospitalist on call if the hospitalist that took care of you is not available. Once you are discharged, your primary care physician will handle any further medical issues. Please note that NO REFILLS for any discharge medications will be authorized once you are discharged, as it is imperative that you return to your primary care  physician (or establish a relationship with a primary care physician if you do not have one) for your aftercare needs so that they can reassess your need for medications and monitor your lab values.  Discharge Instructions    Diet - low sodium heart healthy   Complete by: As directed    Increase activity slowly   Complete by: As directed      Allergies as of 03/11/2020      Reactions   Sulfa Antibiotics Rash      Medication List    STOP taking these medications   ACIDOPHILUS PROBIOTIC PO   docusate sodium 100 MG capsule Commonly known as: COLACE   hydrocortisone 25 MG suppository Commonly known as: ANUSOL-HC   levocetirizine 5 MG tablet Commonly known as: XYZAL   ondansetron 4 MG tablet Commonly known as: ZOFRAN   polyethylene glycol 17 g packet Commonly known as: MIRALAX / GLYCOLAX   simvastatin 40 MG tablet Commonly known as: ZOCOR     TAKE these medications   aspirin 81 MG EC tablet Take 1 tablet (81 mg total) by mouth daily. Swallow whole. Start taking on: March 12, 2020   doxazosin 4 MG tablet Commonly known as: CARDURA Take 1 tablet (4 mg total) by mouth at bedtime. What changed: how much to take   GAS-X PO Take 1 tablet by mouth 3 (three) times daily as needed (for bloating).   loperamide 2 MG capsule Commonly known as: IMODIUM Take 2 mg by mouth 4 (four) times daily as needed for diarrhea or loose stools.   propranolol ER 160 MG SR capsule Commonly known as: INDERAL LA Take 1 capsule (160 mg total) by mouth daily. Start taking on: March 12, 2020      Allergies  Allergen Reactions  . Sulfa Antibiotics Rash    Contact information for after-discharge care    [place] .   Service: Inpatient Hospice Contact information: [address] [place] [phone]                   The results of significant diagnostics from this hospitalization (including imaging,  microbiology, ancillary and laboratory) are listed below for reference.    Significant Diagnostic Studies: CT Angio Chest PE W/Cm &/Or Wo Cm  Result Date: 03/11/2020 CLINICAL DATA:  Atrial fibrillation, metastatic rectal cancer, progressive dyspnea EXAM: CT ANGIOGRAPHY CHEST WITH CONTRAST TECHNIQUE: Multidetector CT imaging of the chest was performed using the standard protocol during bolus administration of intravenous contrast. Multiplanar CT image reconstructions and MIPs were obtained to evaluate the vascular anatomy. CONTRAST:  172mL OMNIPAQUE IOHEXOL 350 MG/ML SOLN COMPARISON:  None. FINDINGS: Cardiovascular: There is adequate opacification of the pulmonary arterial tree. There is no intraluminal filling defect identified to suggest acute pulmonary embolism. The central pulmonary arteries are of normal caliber. There is extensive multi-vessel coronary artery calcification. Global cardiac size within normal limits. No pericardial effusion. Mild atherosclerotic calcification within the thoracic aorta. No aortic aneurysm. Left internal jugular chest  port tip is seen within the deep right atrium. Mediastinum/Nodes: No enlarged mediastinal, hilar, or axillary lymph nodes. Thyroid gland, trachea, and esophagus demonstrate no significant findings. Lungs/Pleura: Lung volumes are small and there is bibasilar atelectasis. No superimposed focal pulmonary infiltrate or nodule. No pneumothorax or pleural effusion. Central airways are widely patent. Upper Abdomen: At least 2 rim enhancing lesions are seen within the liver measuring up to 6.5 cm within the right hepatic lobe in keeping with the given history of metastatic rectal cancer. No acute abnormality. Musculoskeletal: No lytic or blastic bone lesions. No acute bone abnormality. Review of the MIP images confirms the above findings. IMPRESSION: No pulmonary embolism. Extensive multi-vessel coronary artery calcification. Pulmonary hypoinflation with bibasilar  atelectasis. Multiple enhancing lesions within the visualized liver compatible with the given history of hepatic metastatic disease. Aortic Atherosclerosis (ICD10-I70.0). Electronically Signed   By: Fidela Salisbury MD   On: 03/11/2020 00:14   DG Chest Port 1 View  Result Date: 03/10/2020 CLINICAL DATA:  Short of breath EXAM: PORTABLE CHEST 1 VIEW COMPARISON:  None. FINDINGS: Right-sided central venous port tip over the right atrium. Low lung volumes. Mild consolidation at the left base. No pleural effusion. Normal heart size. No pneumothorax. IMPRESSION: Low lung volumes with atelectasis or mild pneumonia at the left lung base. Electronically Signed   By: Donavan Foil M.D.   On: 03/10/2020 22:25    Microbiology: Recent Results (from the past 240 hour(s))  Resp Panel by RT-PCR (Flu A&B, Covid) Nasopharyngeal Swab     Status: None   Collection Time: 03/10/20  9:23 PM   Specimen: Nasopharyngeal Swab; Nasopharyngeal(NP) swabs in vial transport medium  Result Value Ref Range Status   SARS Coronavirus 2 by RT PCR NEGATIVE NEGATIVE Final    Comment: (NOTE) SARS-CoV-2 target nucleic acids are NOT DETECTED.  The SARS-CoV-2 RNA is generally detectable in upper respiratory specimens during the acute phase of infection. The lowest concentration of SARS-CoV-2 viral copies this assay can detect is 138 copies/mL. A negative result does not preclude SARS-Cov-2 infection and should not be used as the sole basis for treatment or other patient management decisions. A negative result may occur with  improper specimen collection/handling, submission of specimen other than nasopharyngeal swab, presence of viral mutation(s) within the areas targeted by this assay, and inadequate number of viral copies(<138 copies/mL). A negative result must be combined with clinical observations, patient history, and epidemiological information. The expected result is Negative.  Fact Sheet for Patients:   EntrepreneurPulse.com.au  Fact Sheet for Healthcare Providers:  IncredibleEmployment.be  This test is no t yet approved or cleared by the [place] FDA and  has been authorized for detection and/or diagnosis of SARS-CoV-2 by FDA under an Emergency Use Authorization (EUA). This EUA will remain  in effect (meaning this test can be used) for the duration of the COVID-19 declaration under Section 564(b)(1) of the Act, 21 U.S.C.section 360bbb-3(b)(1), unless the authorization is terminated  or revoked sooner.       Influenza A by PCR NEGATIVE NEGATIVE Final   Influenza B by PCR NEGATIVE NEGATIVE Final    Comment: (NOTE) The Xpert Xpress SARS-CoV-2/FLU/RSV plus assay is intended as an aid in the diagnosis of influenza from Nasopharyngeal swab specimens and should not be used as a sole basis for treatment. Nasal washings and aspirates are unacceptable for Xpert Xpress SARS-CoV-2/FLU/RSV testing.  Fact Sheet for Patients: EntrepreneurPulse.com.au  Fact Sheet for Healthcare Providers: IncredibleEmployment.be  This test is not yet approved or  cleared by the [place] and has been authorized for detection and/or diagnosis of SARS-CoV-2 by FDA under an Emergency Use Authorization (EUA). This EUA will remain in effect (meaning this test can be used) for the duration of the COVID-19 declaration under Section 564(b)(1) of the Act, 21 U.S.C. section 360bbb-3(b)(1), unless the authorization is terminated or revoked.  Performed at [facility], [address], [place], [place] [address]      Labs: Basic Metabolic Panel: Recent Labs  Lab 03/10/20 2155 03/11/20 0501  NA 136 137  K 4.4 4.4  CL 98 100  CO2 28 29  GLUCOSE 185* 198*  BUN 28* 28*  CREATININE 0.56* 0.56*  CALCIUM 8.7* 8.4*   Liver Function Tests: Recent Labs  Lab 03/10/20 2155 03/11/20 0501  AST 322* 256*  ALT 468* 387*  ALKPHOS  65 50  BILITOT 0.6 0.8  PROT 5.8* 4.8*  ALBUMIN 3.3* 2.8*   No results for input(s): LIPASE, AMYLASE in the last 168 hours. No results for input(s): AMMONIA in the last 168 hours. CBC: Recent Labs  Lab 03/10/20 2155  WBC 5.5  HGB 12.2*  HCT 38.1*  MCV 101.6*  PLT 123*   Cardiac Enzymes: No results for input(s): CKTOTAL, CKMB, CKMBINDEX, TROPONINI in the last 168 hours. BNP: BNP (last 3 results) Recent Labs    03/10/20 2155  BNP 61.0    ProBNP (last 3 results) No results for input(s): PROBNP in the last 8760 hours.  CBG: No results for input(s): GLUCAP in the last 168 hours.     Signed:  Annita Brod, MD [facility] 03/11/2020, 5:23 PM

## 2020-03-11 NOTE — ED Notes (Signed)
Educated pt. About how his medications would help him comfortable. Pt. Wishes to speak to sister first.

## 2020-03-11 NOTE — Progress Notes (Signed)
ANTICOAGULATION CONSULT NOTE -   Pharmacy Consult for Heparin Indication: chest pain/ACS  Allergies  Allergen Reactions  . Sulfa Antibiotics Rash    Patient Measurements: Height: 6' (182.9 cm) Weight: 88 kg (194 lb 0.1 oz) IBW/kg (Calculated) : 77.6 Heparin Dosing Weight: TBW  Vital Signs: BP: 161/83 (01/06 0830) Pulse Rate: 111 (01/06 0830)  Labs: Recent Labs    03/10/20 2155 03/11/20 0005 03/11/20 0501 03/11/20 0814  HGB 12.2*  --   --   --   HCT 38.1*  --   --   --   PLT 123*  --   --   --   HEPARINUNFRC  --   --   --  0.36  CREATININE 0.56*  --  0.56*  --   TROPONINIHS 2,701* 2,101* 2,315*  --     Estimated Creatinine Clearance: 75.4 mL/min (A) (by C-G formula based on SCr of 0.56 mg/dL (L)).   Medical History: Past Medical History:  Diagnosis Date  . Atrial fibrillation with rapid ventricular response (HCC)   . Cancer (HCC)    Rectal CA with mets to liver  . Encephalopathy acute   . Hyperlipidemia   . Hypertension   . Thyrotoxicosis     Medications:  Awaiting electronic med rec  Assessment: 87 y.o. M presents with CP. To begin heparin for r/o ACS. No AC PTA. Plt slightly low at admission at 123.   HL 0.36- therapeutic  Goal of Therapy:  Heparin level 0.3-0.7 units/ml Monitor platelets by anticoagulation protocol: Yes   Plan:  Continue heparin infusion at 1200 units/hr Repeat heparin level in 8 hours Daily heparin level and CBC  Judeth Cornfield, PharmD Clinical Pharmacist 03/11/2020 9:24 AM

## 2020-03-11 NOTE — Consult Note (Addendum)
Cardiology Consult    Patient ID: Nicholas Hines; [id number]; 11/07/1933   Admit date: 03/10/2020 Date of Consult: 03/11/2020  Primary Care Provider: [name], MD Primary Cardiologist: Nicholas Hines  Patient Profile    Nicholas Hines is a 87 y.o. 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 He did have dyspnea during admission and a CTA was negative for a PE and an echo was obtained and showed a preserved EF of 60-65%. Was found to have atrial fibrillation with RVR and Metoprolol was transitioned to Propranolol dye to his thyrotoxicosis. He was not started on anticoagulation due to his recent GIB and was recommended to review this as an outpatient.   Nicholas Hines presented to [facility] ED on 03/10/2020 from the [facility] for evaluation of worsening dyspnea. The patient says he has been experiencing shortness of breath since being on immunotherapy and thought his symptoms were secondary to that. Denies any chest pain or palpitation. No recent orthopnea or PND. Does have lower extremity edema. His main concern at this time is he is uncomfortable on the stretcher and requests a bed or chair. He denies any known history of CAD or CHF. No known family history of cardiac issues. Says his dad passed away from a brain tumor and his mom lived to the age of 55.  Initial labs this admission show WBC 5.5, Hgb 12.2, platelets 123, Na+ 136, K+ 4.4 and creatinine 0.56. AST 322 and ALT 468 (AST was 198 and ALT 336 by labs on 03/09/2020). Initial HS Troponin 2701 with repeat values of 2101 and 2315. BNP 61. COVID negative.  CXR showing low lung volumes with atelectasis or mild PNA of left lung base. CTA chest showed no evidence of PE but noted to have multi-vessel coronary artery calcification. EKG shows NSR, HR 89 with RBBB and slight ST elevation along Lead III.    Past Medical History:  Diagnosis Date  . Atrial fibrillation with rapid ventricular response (HCC)   . Cancer (HCC)    Rectal CA with mets to liver  . Encephalopathy acute   . Hyperlipidemia   . Hypertension   . Thyrotoxicosis     Past Surgical History:  Procedure Laterality Date  . BIOPSY  08/31/2019   Procedure: BIOPSY;  Surgeon: Kathi Der, MD;  Location: [facility] ENDOSCOPY;  Service: Gastroenterology;;  . Wenda Low SIGMOIDOSCOPY N/A 08/31/2019   Procedure: FLEXIBLE SIGMOIDOSCOPY;  Surgeon: Kathi Der, MD;  Location: [facility] ENDOSCOPY;  Service: Gastroenterology;  Laterality: N/A;     Home Medications:  Prior to Admission medications   Medication Sig Start Date End Date Taking? Authorizing Provider  allopurinol (ZYLOPRIM) 100 MG tablet Take 100 mg by mouth daily.    [provider]  cholecalciferol (VITAMIN D3) 25 MCG (1000 UNIT) tablet Take 1,000 Units by mouth daily.    [provider]  docusate sodium (COLACE) 100 MG capsule Take 1 capsule (100 mg total) by mouth 2 (two) times daily. 09/07/19   Joseph Art, DO  doxazosin (CARDURA) 4 MG tablet Take 1 tablet (4 mg total) by mouth at bedtime.  09/07/19   Geradine Girt, DO  esomeprazole (NEXIUM) 40 MG capsule Take 40 mg by mouth daily at 12 noon.    [provider]  finasteride (PROSCAR) 5 MG tablet Take 5 mg by mouth daily.    [provider]  hydrocortisone (ANUSOL-HC) 25 MG suppository Place 1 suppository (25 mg total) rectally 2 (two) times daily. 09/07/19   Geradine Girt, DO  Lactobacillus (ACIDOPHILUS PROBIOTIC PO) Take 1 tablet by mouth daily.    [provider]  levocetirizine (XYZAL) 5 MG tablet Take 5 mg by mouth every evening.     [provider]  lidocaine (XYLOCAINE) 5 % ointment Apply topically 2 (two) times daily. 09/07/19   Geradine Girt, DO  midodrine (PROAMATINE) 5 MG tablet Take 1 tablet (5 mg total) by mouth 3 (three) times daily with meals. 09/07/19   Geradine Girt, DO  Multiple Vitamins-Minerals (CENTRUM SILVER 50+MEN PO) Take 1 tablet by mouth daily.     [provider]  polyethylene glycol (MIRALAX / GLYCOLAX) 17 g packet Take 17 g by mouth daily as needed for mild constipation or moderate constipation.     [provider]  potassium citrate (UROCIT-K) 10 MEQ (1080 MG) SR tablet Take 20 mEq by mouth in the morning and at bedtime.     [provider]  Simethicone (GAS-X PO) Take 1 tablet by mouth 3 (three) times daily as needed (for bloating).    [provider]  simvastatin (ZOCOR) 40 MG tablet Take 40 mg by mouth daily.    [provider]  Wheat Dextrin (BENEFIBER PO) Take by mouth See admin instructions. Mix 2 teaspoonfuls with coffee and drink every morning    [provider]    Inpatient Medications: Scheduled Meds: . aspirin EC  81 mg Oral Daily  . furosemide  40 mg Intravenous Daily  . propranolol ER  160 mg Oral Daily   Continuous Infusions: . heparin 1,200 Units/hr (03/11/20 0114)   PRN Meds:   Allergies:    Allergies  Allergen Reactions  . Sulfa Antibiotics Rash    Social History:   Social History   Socioeconomic History  . Marital status: Single    Spouse name: Not on file  . Number of children: Not on file  . Years of education: Not on file  . Highest education level: Not on file  Occupational History  . Not on file  Tobacco Use  . Smoking status: Never Smoker  . Smokeless tobacco: Never Used  Substance and Sexual Activity  . Alcohol use: Never  . Drug use: Never  . Sexual activity: Not Currently  Other Topics Concern  . Not on file  Social History Narrative  . Not on file   Social Determinants of Health    Financial Resource Strain: Not on file  Food Insecurity: Not on file  Transportation Needs: Not on file  Physical Activity: Not on file  Stress: Not on file  Social Connections: Not on file  Intimate Partner Violence: Not on file     Family History:    Family History  Problem Relation Age of Onset  . Brain cancer Father       Review of Systems    General:  No chills, fever, night sweats or weight changes.  Cardiovascular:  No chest pain, orthopnea, palpitations, paroxysmal nocturnal dyspnea. Positive for dyspnea and edema.  Dermatological: No rash, lesions/masses Respiratory: No cough, Positive for dyspnea.  Urologic: No hematuria, dysuria Abdominal:  No nausea, vomiting, diarrhea, bright red blood per rectum, melena, or hematemesis Neurologic:  No visual changes, wkns, changes in mental status. All other systems reviewed and are otherwise negative except as noted above.  Physical Exam/Data    Vitals:   03/11/20 0713 03/11/20 0730 03/11/20 0800 03/11/20 0830  BP:  (!) 143/66 (!) 166/82 (!) 161/83  Pulse: (!) 110 100 (!) 110 (!) 111  Resp: (!) 26 17 (!) 25 (!) 24  Temp:      TempSrc:      SpO2: 98% 98% 98% 96%  Weight:      Height:       No intake or output data in the 24 hours ending 03/11/20 0856 Filed Weights   03/10/20 2106  Weight: 88 kg   Body mass index is 26.31 kg/m.   General: Pleasant, elderly male appearing in NAD. Psych: Normal affect. Neuro: Alert and oriented X 3. Moves all extremities spontaneously. HEENT: Normal  Neck: Supple without bruits or JVD. Lungs:  Resp regular and unlabored, CTA without wheezing or rales. Heart: RRR no s3, s4, or murmurs. Abdomen: Soft, non-tender, non-distended, BS + x 4.  Extremities: No clubbing or cyanosis. 1+ pitting edema bilaterally. DP/PT/Radials 2+ and equal bilaterally.   EKG:  The EKG was personally reviewed and demonstrates: NSR, HR 89 with RBBB and slight ST elevation along Lead III.   Telemetry:   Telemetry was personally reviewed and demonstrates: Sinus tachycardia, HR in 90's to 110's. Occasional PVC's.    Labs/Studies     Relevant CV Studies:  Echocardiogram: 03/07/2020 SUMMARY  Left ventricular systolic function is normal.  LV ejection fraction = 60-65%.  No segmental wall motion abnormalities seen in the left ventricle  Left ventricular filling pattern is indeterminate.  The right ventricle is normal in size and function.  The left atrium is moderately to severely dilated.  There is no significant valvular stenosis or regurgitation.  The inferior vena cava was not visualized during the exam.  There is no pericardial effusion.  There is no comparison study available.     Laboratory Data:  Chemistry Recent Labs  Lab 03/10/20 2155 03/11/20 0501  NA 136 137  K 4.4 4.4  CL 98 100  CO2 28 29  GLUCOSE 185* 198*  BUN 28* 28*  CREATININE 0.56* 0.56*  CALCIUM 8.7* 8.4*  GFRNONAA >60 >60  ANIONGAP 10 8    Recent Labs  Lab 03/10/20 2155 03/11/20 0501  PROT 5.8* 4.8*  ALBUMIN 3.3* 2.8*  AST 322* 256*  ALT 468* 387*  ALKPHOS 65 50  BILITOT 0.6 0.8   Hematology Recent Labs  Lab 03/10/20 2155  WBC 5.5  RBC 3.75*  HGB 12.2*  HCT 38.1*  MCV 101.6*  MCH 32.5  MCHC 32.0  RDW 14.1  PLT 123*   Cardiac EnzymesNo results for input(s): TROPONINI in the last 168 hours. No results for input(s): TROPIPOC in the last 168 hours.  BNP Recent Labs  Lab 03/10/20 2155  BNP 61.0    DDimer No results for input(s): DDIMER in the last 168 hours.  Radiology/Studies:  CT Angio Chest PE W/Cm &/Or Wo Cm  Result Date: 03/11/2020 CLINICAL DATA:  Atrial fibrillation, metastatic rectal cancer, progressive dyspnea EXAM: CT ANGIOGRAPHY CHEST WITH CONTRAST TECHNIQUE: Multidetector CT imaging of the chest was performed using the standard protocol during bolus administration of intravenous contrast. Multiplanar CT image reconstructions and MIPs were obtained to evaluate the  vascular anatomy. CONTRAST:  OMNIPAQUE IOHEXOL 350 MG/ML SOLN  COMPARISON:  None. FINDINGS: Cardiovascular: There is adequate opacification of the pulmonary arterial tree. There is no intraluminal filling defect identified to suggest acute pulmonary embolism. The central pulmonary arteries are of normal caliber. There is extensive multi-vessel coronary artery calcification. Global cardiac size within normal limits. No pericardial effusion. Mild atherosclerotic calcification within the thoracic aorta. No aortic aneurysm. Left internal jugular chest port tip is seen within the deep right atrium. Mediastinum/Nodes: No enlarged mediastinal, hilar, or axillary lymph nodes. Thyroid gland, trachea, and esophagus demonstrate no significant findings. Lungs/Pleura: Lung volumes are small and there is bibasilar atelectasis. No superimposed focal pulmonary infiltrate or nodule. No pneumothorax or pleural effusion. Central airways are widely patent. Upper Abdomen: At least 2 rim enhancing lesions are seen within the liver measuring up to 6.5 cm within the right hepatic lobe in keeping with the given history of metastatic rectal cancer. No acute abnormality. Musculoskeletal: No lytic or blastic bone lesions. No acute bone abnormality. Review of the MIP images confirms the above findings. IMPRESSION: No pulmonary embolism. Extensive multi-vessel coronary artery calcification. Pulmonary hypoinflation with bibasilar atelectasis. Multiple enhancing lesions within the visualized liver compatible with the given history of hepatic metastatic disease. Aortic Atherosclerosis (ICD10-I70.0). Electronically Signed   By: Fidela Salisbury MD   On: 03/11/2020 00:14   DG Chest Port 1 View  Result Date: 03/10/2020 CLINICAL DATA:  Short of breath EXAM: PORTABLE CHEST 1 VIEW COMPARISON:  None. FINDINGS: Right-sided central venous port tip over the right atrium. Low lung volumes. Mild consolidation at the left base. No pleural effusion. Normal  heart size. No pneumothorax. IMPRESSION: Low lung volumes with atelectasis or mild pneumonia at the left lung base. Electronically Signed   By: Donavan Foil M.D.   On: 03/10/2020 22:25    Assessment & Plan    1. NSTEMI - He was recently admitted to [facility] as outlined above and was discharged to SNF but developed worsening dyspnea within hours of his arrival which led to ED evaluation. He denies any associated chest pain or palpitations.  - Initial HS Troponin 2701 with repeat values of 2101 and 2315. EKG shows NSR, HR 89 with RBBB and slight ST elevation along Lead III.  - He has been started on Heparin for anticoagulation. Reviewed findings with the patient and his brother-in-law at the bedside and options for treatment. Given his Stage 4 rectal cancer and recent GIB, he would not be a good candidate for a cardiac catheterization given the concern for tolerating DAPT. He also states he would not want to undergo any procedures and "is ready to go home and be with the Lord". Would recommend a Palliative Care consult. Will review with Dr. Domenic Polite in regards to obtaining a limited echo for reassessment of his EF as this would not change his treatment strategy. EF was at 60-65% by recent echo on 03/07/2020. - Continue IV Heparin and anticipate a 48 hour course. Restart ASA 81mg  daily and restart Propranolol 160mg  daily. Will not restart statin therapy given elevated LFT's (AST 322 and ALT 468 on admission).  2. Paroxysmal Atrial Fibrillation - He denies any recent palpitations and is in NSR this admission. Will restart PTA Propranolol ER 160mg  daily as initiated during his recent admission at [facility].  - This patients CHA2DS2-VASc Score and unadjusted Ischemic Stroke Rate (% per year) is equal to 4.8 % stroke rate/year from a score of 4 (HTN, Vascular, Age (2)). He has not been on anticoagulation due to rectal bleeding and Stage IV rectal  cancer with mets.   3. HTN - BP has been variable from 132/64 - 178/152  since admission, at 161/83 on most recent check. Will restart PTA Propranolol 160mg  daily and continue to follow.    4. 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 Receiving Palliative Immunotherapy by review of notes.   5. Hyperthyroidism - Recently admitted at [facility] for this and by review of notes was switched to Propranolol and was started on Hydrocortisone with outpatient Endocrinology follow-up recommended.     For questions or updates, please contact [place] Please consult [email or web address] for contact info under Cardiology/STEMI.  Signed, Erma Heritage, PA-C 03/11/2020, 8:56 AM Pager: [phone]   Attending note:  Patient seen and examined.  I reviewed his records and discussed the case with Ms. Delano Metz, I agree with her above findings.  Patient just recently admitted to the [facility] after hospitalization at [facility] for treatment of hyperthyroidism.  He has stage IV small cell carcinoma of the rectum with liver metastasis and is on palliative immunotherapy.  He did have transient atrial fibrillation with RVR, treated with propranolol but not anticoagulated with history of GI bleed.  He was sent to the [facility] ER due to worsening shortness of breath, patient states that this has been waxing and waning, not necessarily Nicholas.  No reported chest pain.  Cardiology was consulted secondary to abnormal high-sensitivity troponin I levels in the 2000's.  On examination he is short of breath, denies active chest pain.  He is afebrile, heart rate 100-110 in sinus tachycardia, systolic blood pressure [id number] to 160s.  Lungs exhibit decreased breath sounds at the bases with scattered rhonchi.  Cardiac exam with RRR no obvious gallop.  Peripheral edema present.  Pertinent lab work includes potassium 4.4, creatinine 0.56, AST 256, ALT 387, high-sensitivity troponin I levels ranging from 2100-2700 with relatively flat pattern,  hemoglobin 12.2, platelets 123, coronavirus 2 testing negative.  Chest CTA reports no pulmonary embolus.  He does have significant coronary artery calcification and multivessel distribution, also multiple enhancing lesions within the liver consistent with metastasis.  I personally reviewed his ECG which shows sinus rhythm with right bundle branch block and left anterior fascicular block, old.  Type II NSTEMI, no active chest pain and ECG without acute findings over baseline.  In light of active comorbidities would suggest conservative management with medical therapy rather than pursuing invasive cardiac testing, this was discussed with the patient and he is in agreement.  Start aspirin 81 mg daily, resume propranolol and uptitrate as needed.  Hold off on statin therapy for now with transaminitis, anticipate 48-hour course of IV heparin if tolerated.  Agree with not pursuing long-term anticoagulation for stroke prophylaxis with PAF in light of his history of rectal bleeding and stage IV rectal cancer with metastasis.  Would suggest Palliative Care consultation.  Satira Sark, M.D., F.A.C.C.

## 2020-03-11 NOTE — TOC Transition Note (Signed)
Transition of Care [facility]) - CM/SW Discharge Note   Patient Details  Name: Nicholas Hines MRN: [id number] Date of Birth: November 07, 1933  Transition of Care [facility]) CM/SW Contact:  Villa Herb, LCSWA Phone Number: 03/11/2020, 5:44 PM   Clinical Narrative:    CSW received call from Cassandra with [facility] to update that equipment had been delivered to the home. CSW updated Attending Dr. Rito Ehrlich, he states pt can d/c home tonight. CSW updated pts sister Darel Hong that pt would be d/c this evening. Pt visited pt in room to update that he would be d/c home tonight and that his equipment had been delivered. Pt agreeable. CSW scheduled EMS pick up for pt. CSW updated pts RN Florentina Addison that EMS has been called and pt is on the schedule to be picked up. CSW informed on call RN Judeth Cornfield with Hospice that pt would be d/c home, RN asked that update be provided to her when pt is leaving hospital and if not too late hospice will visit pt tonight, if too late they will visit tomorrow morning. TOC signing off.    Final next level of care: Home w Hospice Care Barriers to Discharge: Barriers Resolved   Patient Goals and CMS Choice Patient states their goals for this hospitalization and ongoing recovery are:: Discharge home with hospice CMS Medicare.gov Compare Post Acute Care list provided to:: Patient Choice offered to / list presented to : Patient,Sibling  Discharge Placement                  Name of family member notified: Blair Heys Patient and family notified of of transfer: 03/11/20  Discharge Plan and Services In-house Referral: Clinical Social Work,Hospice / Palliative Care Discharge Planning Services: NA Post Acute Care Choice: Hospice          DME Arranged: Hospital bed DME Agency: [facility]       HH Arranged: NA HH Agency: [facility] Date [facility] Agency Contacted: 03/11/20 Time HH Agency Contacted: [phone] Representative spoke with at [facility] Agency: Cassandra  Social  Determinants of Health (SDOH) Interventions     Readmission Risk Interventions No flowsheet data found.

## 2020-03-11 NOTE — H&P (Signed)
History and Physical    Nicholas Hines:[id number] DOB: 08-Jul-1933 DOA: 03/10/2020  PCP: Celene Squibb, MD Patient coming from: [facility]  Chief Complaint: Shortness of breath  HPI: Nicholas Hines is a 87 y.o. male with medical history significant of A. 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  Patient was recently admitted to [facility] and discharged to [facility] earlier the same day.  He had a CT angiogram PE study done for dyspnea during this admission which was negative for PE.  TEE with EF 60 to 65% and left ventricular filling pattern that is indeterminate, no segmental wall motion abnormalities seen in the left ventricle, left atrium moderately to severely dilated, no significant valvular stenosis or regurgitation.  His TSH was low and free T4 elevated concerning for hyperthyroidism in the setting of immunotherapy.  Endocrinology was consulted for thyrotoxicosis.  Thyrotropin receptor antibody <1.10. His beta-blocker was switched to propranolol to prevent conversion of T4 to T3.   Patient states he has had shortness of breath for several weeks and can barely move around.  Denies chest pain.  He is fully vaccinated against Covid including booster shot.  Denies fevers or cough.  States he is currently on immunotherapy for his metastatic rectal cancer and received a dose of few days ago.  States he had cardiac catheterization done about 35 years ago and it was normal at that time.  Since then, he has not had any further testing of his heart done.  He takes a baby aspirin daily.  Also reports bilateral lower extremity edema for several weeks.  No other complaints.  Denies nausea, vomiting, or abdominal pain.  ED Course: Afebrile.  Slightly tachycardic.  Tachypneic.   Placed on 2 L supplemental oxygen.  WBC 5.5, hemoglobin 12.2, hematocrit 38.1, platelet count 123K.  Sodium 136, potassium 4.4, chloride 98, bicarb 28, BUN 28, creatinine 0.5, glucose 185.  AST 322, ALT 468.  Alk phos and T bili normal.  SARS-CoV-2 PCR test negative.  Influenza panel negative.  BNP normal.  High-sensitivity troponin 2701 > 2101.  Chest x-ray showing low lung volumes with atelectasis or mild pneumonia at the left lung base.  CT angiogram chest negative for PE.  Showing extensive multivessel coronary artery calcification.  Pulmonary hypoinflation with bibasilar atelectasis.  Multiple enhancing lesions within the visualized liver compatible with given history of hepatic metastatic disease.  Aortic atherosclerosis.  Patient was given aspirin 324 mg.  He was given heparin bolus and started on infusion.  ED provider discussed the case with on-call cardiologist at [facility] who indicated that there is no need for emergent cardiac intervention/cath or transfer.  Stated that from their standpoint patient can be medically managed at [facility] and consult cardiology rounding team here in the morning.  Review of Systems:  All systems reviewed and apart from history of presenting illness, are negative.  Past Medical History:  Diagnosis Date  . Atrial fibrillation with rapid ventricular response ([place])   . Cancer ([place])    Rectal CA with mets to liver  . Encephalopathy acute   . Hyperlipidemia   . Hypertension   . Thyrotoxicosis     Past Surgical History:  Procedure Laterality Date  . BIOPSY  08/31/2019   Procedure: BIOPSY;  Surgeon: Otis Brace, MD;  Location: [place];  Service: Gastroenterology;;  . Otho Darner SIGMOIDOSCOPY N/A 08/31/2019   Procedure: FLEXIBLE SIGMOIDOSCOPY;  Surgeon: Otis Brace, MD;  Location: [place];  Service: Gastroenterology;  Laterality: N/A;     reports that he has never smoked. He has never used smokeless tobacco. He reports that he does  not drink alcohol and does not use drugs.  Allergies  Allergen Reactions  . Sulfa Antibiotics Rash    History reviewed. No pertinent family history.  Prior to Admission medications   Medication Sig Start Date End Date Taking? Authorizing Provider  allopurinol (ZYLOPRIM) 100 MG tablet Take 100 mg by mouth daily.    [provider]  cholecalciferol (VITAMIN D3) 25 MCG (1000 UNIT) tablet Take 1,000 Units by mouth daily.    [provider]  docusate sodium (COLACE) 100 MG capsule Take 1 capsule (100 mg total) by mouth 2 (two) times daily. 09/07/19   Geradine Girt, DO  doxazosin (CARDURA) 4 MG tablet Take 1 tablet (4 mg total) by mouth at bedtime. 09/07/19   Geradine Girt, DO  esomeprazole (NEXIUM) 40 MG capsule Take 40 mg by mouth daily at 12 noon.    [provider]  finasteride (PROSCAR) 5 MG tablet Take 5 mg by mouth daily.    [provider]  hydrocortisone (ANUSOL-HC) 25 MG suppository Place 1 suppository (25 mg total) rectally 2 (two) times daily. 09/07/19   Geradine Girt, DO  Lactobacillus (ACIDOPHILUS PROBIOTIC PO) Take 1 tablet by mouth daily.    [provider]  levocetirizine (XYZAL) 5 MG tablet Take 5 mg by mouth every evening.    [provider]  lidocaine (XYLOCAINE) 5 % ointment Apply topically 2 (two) times daily. 09/07/19   Geradine Girt, DO  midodrine (PROAMATINE) 5 MG tablet Take 1 tablet (5 mg total) by mouth 3 (three) times daily with meals. 09/07/19   Geradine Girt, DO  Multiple Vitamins-Minerals (CENTRUM SILVER 50+MEN PO) Take 1 tablet by mouth daily.     [provider]  polyethylene glycol (MIRALAX / GLYCOLAX) 17 g packet Take 17 g by mouth daily as needed for mild constipation or moderate constipation.     [provider]  potassium citrate (UROCIT-K) 10 MEQ (1080 MG) SR tablet Take 20 mEq by mouth in the morning and at bedtime.     [provider]  Simethicone (GAS-X PO) Take 1 tablet by  mouth 3 (three) times daily as needed (for bloating).    [provider]  simvastatin (ZOCOR) 40 MG tablet Take 40 mg by mouth daily.    [provider]  Wheat Dextrin (BENEFIBER PO) Take by mouth See admin instructions. Mix 2 teaspoonfuls with coffee and drink every morning    [provider]    Physical Exam: Vitals:   03/10/20 2315 03/10/20 2330 03/11/20 0000 03/11/20 0030  BP:  (!) 154/122 (!) 152/118 (!) 144/98  Pulse: (!) 109 96 (!) 113 (!) 110  Resp: (!) 34 20 (!) 21 (!) 23  Temp:      TempSrc:      SpO2: 90% 98% 96% 97%  Weight:      Height:        Physical Exam Constitutional:      General: He is not in acute distress. HENT:     Head: Normocephalic and atraumatic.  Eyes:     Extraocular Movements: Extraocular movements intact.     Conjunctiva/sclera: Conjunctivae normal.  Cardiovascular:     Rate and Rhythm: Regular  rhythm. Tachycardia present.     Pulses: Normal pulses.     Comments: Slightly tachycardic Pulmonary:     Effort: Pulmonary effort is normal. No respiratory distress.     Breath sounds: Normal breath sounds. No wheezing or rales.  Abdominal:     General: Bowel sounds are normal.     Palpations: Abdomen is soft.     Tenderness: There is no abdominal tenderness. There is no guarding.  Musculoskeletal:     Cervical back: Normal range of motion and neck supple.     Right lower leg: Edema present.     Left lower leg: Edema present.     Comments: +4 pitting edema bilateral lower extremities  Skin:    General: Skin is warm and dry.  Neurological:     General: No focal deficit present.     Mental Status: He is alert and oriented to person, place, and time.     Labs on Admission: I have personally reviewed following labs and imaging studies  CBC: Recent Labs  Lab 03/10/20 2155  WBC 5.5  HGB 12.2*  HCT 38.1*  MCV 101.6*  PLT 948*   Basic Metabolic Panel: Recent Labs  Lab 03/10/20 2155  NA 136  K 4.4  CL 98  CO2  28  GLUCOSE 185*  BUN 28*  CREATININE 0.56*  CALCIUM 8.7*   GFR: Estimated Creatinine Clearance: 75.4 mL/min (A) (by C-G formula based on SCr of 0.56 mg/dL (L)). Liver Function Tests: Recent Labs  Lab 03/10/20 2155  AST 322*  ALT 468*  ALKPHOS 65  BILITOT 0.6  PROT 5.8*  ALBUMIN 3.3*   No results for input(s): LIPASE, AMYLASE in the last 168 hours. No results for input(s): AMMONIA in the last 168 hours. Coagulation Profile: No results for input(s): INR, PROTIME in the last 168 hours. Cardiac Enzymes: No results for input(s): CKTOTAL, CKMB, CKMBINDEX, TROPONINI in the last 168 hours. BNP (last 3 results) No results for input(s): PROBNP in the last 8760 hours. HbA1C: No results for input(s): HGBA1C in the last 72 hours. CBG: No results for input(s): GLUCAP in the last 168 hours. Lipid Profile: No results for input(s): CHOL, HDL, LDLCALC, TRIG, CHOLHDL, LDLDIRECT in the last 72 hours. Thyroid Function Tests: No results for input(s): TSH, T4TOTAL, FREET4, T3FREE, THYROIDAB in the last 72 hours. Anemia Panel: No results for input(s): VITAMINB12, FOLATE, FERRITIN, TIBC, IRON, RETICCTPCT in the last 72 hours. Urine analysis: No results found for: COLORURINE, APPEARANCEUR, LABSPEC, PHURINE, GLUCOSEU, HGBUR, BILIRUBINUR, KETONESUR, PROTEINUR, UROBILINOGEN, NITRITE, LEUKOCYTESUR  Radiological Exams on Admission: CT Angio Chest PE W/Cm &/Or Wo Cm  Result Date: 03/11/2020 CLINICAL DATA:  Atrial fibrillation, metastatic rectal cancer, progressive dyspnea EXAM: CT ANGIOGRAPHY CHEST WITH CONTRAST TECHNIQUE: Multidetector CT imaging of the chest was performed using the standard protocol during bolus administration of intravenous contrast. Multiplanar CT image reconstructions and MIPs were obtained to evaluate the vascular anatomy. CONTRAST:  161mL OMNIPAQUE IOHEXOL 350 MG/ML SOLN COMPARISON:  None. FINDINGS: Cardiovascular: There is adequate opacification of the pulmonary arterial tree.  There is no intraluminal filling defect identified to suggest acute pulmonary embolism. The central pulmonary arteries are of normal caliber. There is extensive multi-vessel coronary artery calcification. Global cardiac size within normal limits. No pericardial effusion. Mild atherosclerotic calcification within the thoracic aorta. No aortic aneurysm. Left internal jugular chest port tip is seen within the deep right atrium. Mediastinum/Nodes: No enlarged mediastinal, hilar, or axillary lymph nodes. Thyroid gland, trachea, and esophagus demonstrate no significant findings.  Lungs/Pleura: Lung volumes are small and there is bibasilar atelectasis. No superimposed focal pulmonary infiltrate or nodule. No pneumothorax or pleural effusion. Central airways are widely patent. Upper Abdomen: At least 2 rim enhancing lesions are seen within the liver measuring up to 6.5 cm within the right hepatic lobe in keeping with the given history of metastatic rectal cancer. No acute abnormality. Musculoskeletal: No lytic or blastic bone lesions. No acute bone abnormality. Review of the MIP images confirms the above findings. IMPRESSION: No pulmonary embolism. Extensive multi-vessel coronary artery calcification. Pulmonary hypoinflation with bibasilar atelectasis. Multiple enhancing lesions within the visualized liver compatible with the given history of hepatic metastatic disease. Aortic Atherosclerosis (ICD10-I70.0). Electronically Signed   By: Fidela Salisbury MD   On: 03/11/2020 00:14   DG Chest Port 1 View  Result Date: 03/10/2020 CLINICAL DATA:  Short of breath EXAM: PORTABLE CHEST 1 VIEW COMPARISON:  None. FINDINGS: Right-sided central venous port tip over the right atrium. Low lung volumes. Mild consolidation at the left base. No pleural effusion. Normal heart size. No pneumothorax. IMPRESSION: Low lung volumes with atelectasis or mild pneumonia at the left lung base. Electronically Signed   By: Donavan Foil M.D.   On:  03/10/2020 22:25    EKG: Independently reviewed.  Sinus tachycardia, RBBB, LAFB.  No significant change compared to prior tracings.  Assessment/Plan Principal Problem:   NSTEMI (non-ST elevated myocardial infarction) ([place]) Active Problems:   HLD (hyperlipidemia)   HTN (hypertension)   Bilateral lower extremity edema   Rectal cancer ([place])   NSTEMI: Patient is presenting with complaints of dyspnea for several weeks.  No chest pain. High-sensitivity troponin 2701 > 2101.  EKG without acute ischemic changes.  Suspect ACS as he does have extensive multivessel coronary artery calcification on CT. ED provider discussed the case with on-call cardiologist at [facility] who indicated that there is no need for emergent cardiac intervention/cath or transfer.  Stated that from their standpoint patient can be medically managed at [facility] and consult cardiology rounding team here in the morning. -Cardiac monitoring.  Patient was given full dose aspirin in the ED.  Continue heparin infusion.  Trend troponin.  Elevated LFTs: AST 322, ALT 468.  Alk phos and T bili normal.  Transaminases were elevated on recent labs done at [facility] as well.  CT abdomen pelvis done at [facility] on 03/04/2020 showing findings concerning for progressive metastatic disease to the liver.  Patient is not complaining of nausea, vomiting, or abdominal pain. -Continue to monitor LFTs  Bilateral lower extremity edema: He has significant +4 pitting edema bilateral lower extremities.  Suspect due to venous stasis.  Recent echo done at [facility] showing EF of 60 to 65% and left ventricular filling pattern that is indeterminate.  BNP normal.  No signs of pulmonary edema on chest imaging done today. -IV Lasix 40 mg daily  Small cell carcinoma of the rectum with mets to liver: Currently on immunotherapy and followed by oncology at [facility]. Previously treated with chemotherapy July 2021-November 2021 and currently on palliative immunotherapy.   Recent CT abdomen showing findings concerning for progressive metastatic disease. -Outpatient oncology follow-up  Paroxysmal A. fib: Currently in sinus rhythm. -Does not appear to be on chronic anticoagulation, pharmacy med rec pending.  Hypertension: Currently stable. Hyperlipidemia Hyperthyroidism -Resume home meds after pharmacy med rec is done.  DVT prophylaxis: Heparin Code Status: Patient wishes to be DNR. Family Communication: Brother-in-law at bedside. Disposition Plan: Status is: Inpatient  Remains inpatient appropriate because:IV treatments appropriate  due to intensity of illness or inability to take PO and Inpatient level of care appropriate due to severity of illness   Dispo: The patient is from: SNF              Anticipated d/c is to: SNF              Anticipated d/c date is: 3 days              Patient currently is not medically stable to d/c.  The medical decision making on this patient was of high complexity and the patient is at high risk for clinical deterioration, therefore this is a level 3 visit.  Shela Leff MD [facility]  If 7PM-7AM, please contact night-coverage [email or web address]  03/11/2020, 3:51 AM

## 2020-03-11 NOTE — Progress Notes (Signed)
ANTICOAGULATION CONSULT NOTE -   Pharmacy Consult for Heparin Indication: chest pain/ACS  Allergies  Allergen Reactions  . Sulfa Antibiotics Rash    Patient Measurements: Height: 6' (182.9 cm) Weight: 88 kg (194 lb 0.1 oz) IBW/kg (Calculated) : 77.6 Heparin Dosing Weight: TBW  Vital Signs: BP: 148/76 (01/06 1500) Pulse Rate: 108 (01/06 1500)  Labs: Recent Labs    03/10/20 2155 03/11/20 0005 03/11/20 0501 03/11/20 0814 03/11/20 1555  HGB 12.2*  --   --   --   --   HCT 38.1*  --   --   --   --   PLT 123*  --   --   --   --   HEPARINUNFRC  --   --   --  0.36 0.25*  CREATININE 0.56*  --  0.56*  --   --   TROPONINIHS 2,701* 2,101* 2,315* 2,330*  --     Estimated Creatinine Clearance: 75.4 mL/min (A) (by C-G formula based on SCr of 0.56 mg/dL (L)).   Medical History: Past Medical History:  Diagnosis Date  . Atrial fibrillation with rapid ventricular response ([facility])   . Cancer ([facility])    Rectal CA with mets to liver  . Encephalopathy acute   . Hyperlipidemia   . Hypertension   . Thyrotoxicosis     Medications:  Awaiting electronic med rec  Assessment: 87 y.o. M presents with CP. To begin heparin for r/o ACS. No AC PTA. Plt slightly low at admission at 123.   HL 0.25- subtherapeutic  Goal of Therapy:  Heparin level 0.3-0.7 units/ml Monitor platelets by anticoagulation protocol: Yes   Plan:  Increase heparin infusion to 1350 units/hr Repeat heparin level in 8 hours Daily heparin level and CBC  Judeth Cornfield, PharmD Clinical Pharmacist 03/11/2020 5:19 PM

## 2020-03-11 NOTE — TOC Initial Note (Signed)
Transition of Care [facility]) - Initial/Assessment Note   Patient Details  Name: Nicholas Hines MRN: [id number] Date of Birth: [birth year]  Transition of Care [facility]) CM/SW Contact:    Ewing Schlein, LCSW Phone Number: 03/11/2020, 2:09 PM  Clinical Narrative: Patient is an 87 year old male who was admitted for NSTEMI. Family and patient are requesting home hospice services. CSW spoke with patient's sister/POA, Blair Heys, regarding hospice. Family and patient are requesting [facility]. CSW spoke with patient and patient is agreeable to home hospice if a bed at [facility] is not available.  CSW made referral to Cassandra with [facility]. Per Cassandra, there are not currently any beds available at [facility]. CSW explained to patient and sister they can request to be put on the waitlist for [facility] if they want to pursue it after discharge. Family and patient aware that once the needed equipment has been delivered, the patient can discharge home but this will likely be tomorrow morning. Family and patient agreeable. TOC awaiting DME delivery to the home.  Expected Discharge Plan: Home w Hospice Care Barriers to Discharge: Continued Medical Work up  Patient Goals and CMS Choice Patient states their goals for this hospitalization and ongoing recovery are:: Discharge home with hospice CMS Medicare.gov Compare Post Acute Care list provided to:: Patient Choice offered to / list presented to : Patient,Sibling  Expected Discharge Plan and Services Expected Discharge Plan: Home w Hospice Care In-house Referral: Clinical Social Work,Hospice / Palliative Care Discharge Planning Services: NA Post Acute Care Choice: Hospice Living arrangements for the past 2 months: Single Family Home             DME Arranged: Hospital bed DME Agency: [facility] Agency: [facility] Date [facility] Agency Contacted: 03/11/20 Time HH Agency Contacted: 1350 Representative spoke  with at [facility] Agency: Cassandra  Prior Living Arrangements/Services Living arrangements for the past 2 months: Single Family Home Lives with:: Self Patient language and need for interpreter reviewed:: Yes Do you feel safe going back to the place where you live?: Yes      Need for Family Participation in Patient Care: Yes (Comment) Care giver support system in place?: Yes (comment) Criminal Activity/Legal Involvement Pertinent to Current Situation/Hospitalization: No - Comment as needed  Activities of Daily Living Home Assistive Devices/Equipment: Wheelchair,Walker (specify type) ADL Screening (condition at time of admission) Patient's cognitive ability adequate to safely complete daily activities?: Yes Is the patient deaf or have difficulty hearing?: No Does the patient have difficulty seeing, even when wearing glasses/contacts?: No Does the patient have difficulty concentrating, remembering, or making decisions?: No Patient able to express need for assistance with ADLs?: Yes Does the patient have difficulty dressing or bathing?: Yes Independently performs ADLs?: No Communication: Independent Dressing (OT): Needs assistance Is this a change from baseline?: Pre-admission baseline Grooming: Needs assistance Is this a change from baseline?: Pre-admission baseline Feeding: Independent Bathing: Needs assistance Is this a change from baseline?: Pre-admission baseline Toileting: Needs assistance Is this a change from baseline?: Pre-admission baseline In/Out Bed: Needs assistance Is this a change from baseline?: Pre-admission baseline Walks in Home: Independent with device (comment) Does the patient have difficulty walking or climbing stairs?: Yes Weakness of Legs: Both Weakness of Arms/Hands: None  Permission Sought/Granted Permission sought to share information with : Other (comment) Permission granted to share info w AGENCY: [facility]  Emotional  Assessment Appearance:: Appears stated age Attitude/Demeanor/Rapport: Engaged Affect (typically observed): Accepting  Orientation: : Oriented to Self,Oriented to Place,Oriented to  Time,Oriented to Situation Alcohol / Substance Use: Not Applicable Psych Involvement: No (comment)  Admission diagnosis:  NSTEMI (non-ST elevated myocardial infarction) [facility]) [I21.4] Patient Active Problem List   Diagnosis Date Noted  . NSTEMI (non-ST elevated myocardial infarction) ([place]) 03/11/2020  . Bilateral lower extremity edema 03/11/2020  . Rectal cancer ([place]) 03/11/2020  . Liver lesion, right lobe 09/02/2019  . Rectal bleeding 08/30/2019  . HLD (hyperlipidemia) 08/30/2019  . HTN (hypertension) 08/30/2019  . BPH (benign prostatic hyperplasia) 08/30/2019  . Gout 08/30/2019  . Allergic rhinitis 08/30/2019  . GERD (gastroesophageal reflux disease) 08/30/2019   PCP:  Celene Squibb, MD Pharmacy:   [place], [place] [address] Phone: [phone] Fax: [phone]  [facility] [phone] - [place], [place] [id number] [place] [address] Phone: [phone] Fax: [phone]  [facility] [place], [place] [address] Phone: [phone] Fax: [phone]  Readmission Risk Interventions No flowsheet data found.

## 2020-03-11 NOTE — Consult Note (Signed)
Consultation Note Date: 03/11/2020   Patient Name: Nicholas Hines  DOB: 01/29/1934  MRN: [id number]  Age / Sex: 87 y.o., male  PCP: Celene Squibb, MD Referring Physician: Annita Brod, MD  Reason for Consultation: Establishing goals of care, Hospice Evaluation and Psychosocial/spiritual support  HPI/Patient Profile: 87 y.o. male  with past medical history of A. 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   Clinical Assessment and Goals of Care: Nicholas Hines is lying quietly on a stretcher in the ED.  He greets me making and somewhat keeping eye contact.  He appears acutely/chronically ill and quite frail.  He is alert and oriented, able to make his needs known.  Present today at bedside is brother-in-law, Candace Gallus.  Nicholas Hines and I talk about his acute and chronic health concerns.  He shares that he has been taking immunotherapy for cancer, but feels that this is not helping him, in fact has made him worse.  He states that he does not believe that he would take any more immunotherapy.  We also talk about his MI and the treatment plan.  I shared that the doctors have told me he is ready for hospice care.  Nicholas Hines talks at length about his experience with hospice he shares that his mother had in-home hospice care for about 1 year.  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  He is very knowledgeable about what is and is not provided with hospice care.  We talked about equipment needs.  At this point he feels he needs a hospital bed and home oxygen.  He also feels that he needs an ambulance for transportation home.  As we are talking transition of care team member arrives.  We review the  discharge plan.  No further questions or concerns at this time.  Nicholas Hines seems quite frail, and accepting of his circumstance.  He has had a visit from his pastor, and seems at peace.  Conference with attending, bedside nursing staff, transition of care team related to patient condition, needs, goals of care.   HCPOA NEXT OF KIN -Nicholas Hines can make his own healthcare choices.  Sister, Sherlyn Lick is his healthcare surrogate.  He has several sisters and brother-in-law who are active in his care.    SUMMARY OF RECOMMENDATIONS   At this point continue to treat the treatable but no CPR or intubation. Nicholas Hines declines further immunotherapy for his cancer Requesting "treat the treatable" and home hospice care with hospice of [facility] Transition to comfort measures when appropriate/ready.  Code Status/Advance Care Planning:  DNR  Symptom Management:   Per hospitalist, no additional needs at this time.  Per hospice protocol.  Palliative Prophylaxis:   Frequent Pain Assessment and Oral Care  Additional Recommendations (Limitations, Scope, Preferences):  Treat the treatable but  no CPR or intubation.  No further immunotherapy/cancer treatment  Psycho-social/Spiritual:   Desire for further Chaplaincy support:no  Additional Recommendations: Caregiving  Support/Resources  Prognosis:   < 6 months, 3 to 6 months or less would not be surprising based on chronic illness burden, declining functional status, desire for less aggressive treatment and hospice type care.  Discharge Planning: Home with the benefits of "treat the treatable" hospice care with hospice of [facility]      Primary Diagnoses: Present on Admission: . NSTEMI (non-ST elevated myocardial infarction) (HCC) . HLD (hyperlipidemia) . HTN (hypertension) . Bilateral lower extremity edema . Rectal cancer (HCC) . PAF (paroxysmal atrial fibrillation) (HCC)   I have reviewed the medical record,  interviewed the patient and family, and examined the patient. The following aspects are pertinent.  Past Medical History:  Diagnosis Date  . Atrial fibrillation with rapid ventricular response (HCC)   . Cancer (HCC)    Rectal CA with mets to liver  . Encephalopathy acute   . Hyperlipidemia   . Hypertension   . Thyrotoxicosis    Social History   Socioeconomic History  . Marital status: Single    Spouse name: Not on file  . Number of children: Not on file  . Years of education: Not on file  . Highest education level: Not on file  Occupational History  . Not on file  Tobacco Use  . Smoking status: Never Smoker  . Smokeless tobacco: Never Used  Substance and Sexual Activity  . Alcohol use: Never  . Drug use: Never  . Sexual activity: Not Currently  Other Topics Concern  . Not on file  Social History Narrative  . Not on file   Social Determinants of Health   Financial Resource Strain: Not on file  Food Insecurity: Not on file  Transportation Needs: Not on file  Physical Activity: Not on file  Stress: Not on file  Social Connections: Not on file   Family History  Problem Relation Age of Onset  . Brain cancer Father    Scheduled Meds: . aspirin EC  81 mg Oral Daily  . furosemide  40 mg Intravenous Daily  . propranolol ER  160 mg Oral Daily   Continuous Infusions: . heparin 1,200 Units/hr (03/11/20 0946)   PRN Meds:. Medications Prior to Admission:  Prior to Admission medications   Medication Sig Start Date End Date Taking? Authorizing Provider  docusate sodium (COLACE) 100 MG capsule Take 1 capsule (100 mg total) by mouth 2 (two) times daily. 09/07/19  Yes Marlin Canary U, DO  doxazosin (CARDURA) 4 MG tablet Take 1 tablet (4 mg total) by mouth at bedtime. Patient taking differently: Take 8 mg by mouth at bedtime. 09/07/19  Yes Joseph Art, DO  hydrocortisone (ANUSOL-HC) 25 MG suppository Place 1 suppository (25 mg total) rectally 2 (two) times daily. 09/07/19  Yes  Vann, Jessica U, DO  levocetirizine (XYZAL) 5 MG tablet Take 5 mg by mouth daily as needed for allergies.   Yes [provider]  loperamide (IMODIUM) 2 MG capsule Take 2 mg by mouth 4 (four) times daily as needed for diarrhea or loose stools. 03/10/20 04/09/20 Yes [provider]  ondansetron (ZOFRAN) 4 MG tablet Take 4 mg by mouth every 8 (eight) hours as needed for nausea/vomiting. 03/10/20 03/17/20 Yes [provider]  polyethylene glycol (MIRALAX / GLYCOLAX) 17 g packet Take 17 g by mouth daily as needed for mild constipation or moderate constipation.    Yes [provider]  Simethicone (GAS-X PO) Take 1 tablet by mouth 3 (three) times daily as needed (for bloating).   Yes [provider]  simvastatin (ZOCOR) 40 MG tablet Take 40 mg by mouth daily.   Yes [provider]  allopurinol (ZYLOPRIM) 100 MG tablet Take 100 mg by mouth daily. Patient not taking: No sig reported    [provider]  esomeprazole (NEXIUM) 40 MG capsule Take 40 mg by mouth daily at 12 noon. Patient not taking: No sig reported    [provider]  finasteride (PROSCAR) 5 MG tablet Take 5 mg by mouth daily. Patient not taking: No sig reported    [provider]  Lactobacillus (ACIDOPHILUS PROBIOTIC PO) Take 1 tablet by mouth daily.    [provider]  lidocaine (XYLOCAINE) 5 % ointment Apply topically 2 (two) times daily. Patient not taking: No sig reported 09/07/19   Geradine Girt, DO  midodrine (PROAMATINE) 5 MG tablet Take 1 tablet (5 mg total) by mouth 3 (three) times daily with meals. Patient not taking: No sig reported 09/07/19   Geradine Girt, DO  Multiple Vitamins-Minerals (CENTRUM SILVER 50+MEN PO) Take 1 tablet by mouth daily.  Patient not taking: No sig reported    [provider]  potassium citrate (UROCIT-K) 10 MEQ (1080 MG) SR tablet Take 20 mEq by mouth in the morning and at bedtime.  Patient not taking: No sig  reported    [provider]   Allergies  Allergen Reactions  . Sulfa Antibiotics Rash   Review of Systems  Unable to perform ROS: Acuity of condition    Physical Exam Vitals and nursing note reviewed.  Constitutional:      General: He is not in acute distress.    Appearance: He is ill-appearing.  HENT:     Head: Normocephalic and atraumatic.  Cardiovascular:     Rate and Rhythm: Normal rate.  Pulmonary:     Effort: Pulmonary effort is normal. No tachypnea.  Skin:    General: Skin is warm and dry.     Coloration: Skin is pale.  Neurological:     Mental Status: He is alert and oriented to person, place, and time.  Psychiatric:        Mood and Affect: Mood normal.        Behavior: Behavior normal.     Vital Signs: BP (!) 160/81   Pulse (!) 110   Temp 98.1 F (36.7 C) (Oral)   Resp (!) 28   Ht 6' (1.829 m)   Wt 88 kg   SpO2 98%   BMI 26.31 kg/m  Pain Scale: 0-10   Pain Score: 0-No pain   SpO2: SpO2: 98 % O2 Device:SpO2: 98 % O2 Flow Rate: .O2 Flow Rate (L/min): 2 L/min  IO: Intake/output summary: No intake or output data in the 24 hours ending 03/11/20 1440  LBM:   Baseline Weight: Weight: 88 kg Most recent weight: Weight: 88 kg     Palliative Assessment/Data:   Flowsheet Rows   Flowsheet Row Most Recent Value  Intake Tab   Referral Department Hospitalist  Unit at Time of Referral ER  Palliative Care Primary Diagnosis Cardiac  Date Notified 03/11/20  Palliative Care Type New Palliative care  Reason for referral Clarify Goals of Care  Date of Admission 03/11/20  Date first seen by Palliative Care 03/11/20  # of days Palliative referral response time 0 Day(s)  # of days IP prior to Palliative referral  0  Clinical Assessment   Palliative Performance Scale Score 40%  Pain Max last 24 hours Not able to report  Pain Min Last 24 hours Not able to report  Dyspnea Max Last 24 Hours Not able to report  Dyspnea Min Last 24 hours Not able to  report  Psychosocial & Spiritual Assessment   Palliative Care Outcomes       Time In: 1320 Time Out: 1410 Time Total: 50 minutes Greater than 50%  of this time was spent counseling and coordinating care related to the above assessment and plan.  Signed by: Drue Novel, NP   Please contact Palliative Medicine Team phone at [phone] for questions and concerns.  For individual provider: See Shea Evans

## 2020-03-11 NOTE — Progress Notes (Signed)
   Follow Up Note  HPI: 87 year old male with past medical history of metastatic carcinoma of the rectum, paroxysmal A. fib and hypertension who was residing at the [facility] when he was brought in for worsening dyspnea.  He was also noted to have some lower extremity edema.  In the emergency room, initial troponin was at 2700 and chest x-ray noted some mild atelectasis at left lung base.  EKG with no evidence of acute ischemic changes.  Case was discussed with on-call cardiologist and given patient's advanced age and comorbidities of active cancer, felt that he should be medically managed and not a candidate for cardiac catheterization.   Pt admitted earlier this morning.  Waiting for bed in the emergency room.  Patient is uncomfortable.  When asked him about what he wanted done than what he did not.  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  He had requested the hospice center [facility].  Palliative care consulted.  Social work reached out to family who requested hospice at home.  Exam: CV: Regular rate and rhythm, S1-S2 Lungs: Decreased breath sounds bibasilar Abd: Soft, nontender, nondistended, positive bowel sounds Ext: No clubbing or cyanosis, 1-2+ pitting edema bilaterally  Principal Problem:   NSTEMI (non-ST elevated myocardial infarction) (HCC): No intervention.  Medically managing and currently on heparin.  Daily aspirin.   Active Problems:   HLD (hyperlipidemia)   HTN (hypertension)   Bilateral lower extremity edema   Rectal cancer (HCC)   PAF (paroxysmal atrial fibrillation) (HCC): Currently normal sinus rhythm  Disposition: Setting up hospice at home.  Palliative care to see.

## 2020-03-11 NOTE — ED Notes (Signed)
Pt. Requested to speak with case management. Notified Megan in case management that pt. Is requesting to speak with her.

## 2020-03-12 DIAGNOSIS — I10 Essential (primary) hypertension: Secondary | ICD-10-CM | POA: Diagnosis not present

## 2020-03-12 DIAGNOSIS — E785 Hyperlipidemia, unspecified: Secondary | ICD-10-CM | POA: Diagnosis not present

## 2020-03-12 DIAGNOSIS — I249 Acute ischemic heart disease, unspecified: Secondary | ICD-10-CM | POA: Diagnosis not present

## 2020-03-12 DIAGNOSIS — C20 Malignant neoplasm of rectum: Secondary | ICD-10-CM | POA: Diagnosis not present

## 2020-03-12 DIAGNOSIS — R1310 Dysphagia, unspecified: Secondary | ICD-10-CM | POA: Diagnosis not present

## 2020-03-12 DIAGNOSIS — R531 Weakness: Secondary | ICD-10-CM | POA: Diagnosis not present

## 2020-03-12 DIAGNOSIS — C787 Secondary malignant neoplasm of liver and intrahepatic bile duct: Secondary | ICD-10-CM | POA: Diagnosis not present

## 2020-03-12 DIAGNOSIS — I214 Non-ST elevation (NSTEMI) myocardial infarction: Secondary | ICD-10-CM | POA: Diagnosis not present

## 2020-03-12 DIAGNOSIS — R0602 Shortness of breath: Secondary | ICD-10-CM | POA: Diagnosis not present

## 2020-03-12 DIAGNOSIS — I4891 Unspecified atrial fibrillation: Secondary | ICD-10-CM | POA: Diagnosis not present

## 2020-03-12 DIAGNOSIS — R630 Anorexia: Secondary | ICD-10-CM | POA: Diagnosis not present

## 2020-03-14 DIAGNOSIS — I10 Essential (primary) hypertension: Secondary | ICD-10-CM | POA: Diagnosis not present

## 2020-03-14 DIAGNOSIS — I4891 Unspecified atrial fibrillation: Secondary | ICD-10-CM | POA: Diagnosis not present

## 2020-03-14 DIAGNOSIS — I214 Non-ST elevation (NSTEMI) myocardial infarction: Secondary | ICD-10-CM | POA: Diagnosis not present

## 2020-03-14 DIAGNOSIS — C787 Secondary malignant neoplasm of liver and intrahepatic bile duct: Secondary | ICD-10-CM | POA: Diagnosis not present

## 2020-03-14 DIAGNOSIS — I249 Acute ischemic heart disease, unspecified: Secondary | ICD-10-CM | POA: Diagnosis not present

## 2020-03-14 DIAGNOSIS — C20 Malignant neoplasm of rectum: Secondary | ICD-10-CM | POA: Diagnosis not present

## 2020-04-06 DEATH — deceased

## 2022-05-10 IMAGING — MR MR ABDOMEN WO/W CM
12 of 20 series · 22 of 48 positions shown · IV contrast (9 g)
Comparison: CT scan 08/30/2019

CLINICAL DATA: Liver lesion and patient with history of rectal
mass.

EXAM:
MRI ABDOMEN WITHOUT AND WITH CONTRAST
TECHNIQUE: Multiplanar multisequence MR imaging of the abdomen was performed
both before and after the administration of intravenous contrast.
CONTRAST:  9mL GADAVIST GADOBUTROL 1 MMOL/ML IV SOLN

[Series 4: cor ssfse nav · coronal · 6.0mm · 0.78mm/px · 2 of 38 slices shown]
[im 1/38]
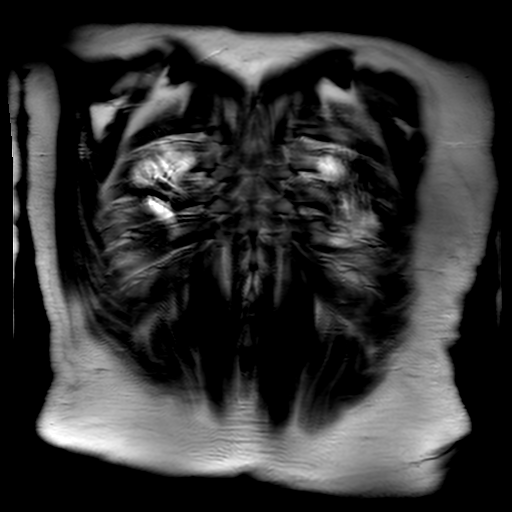
[im 38/38]
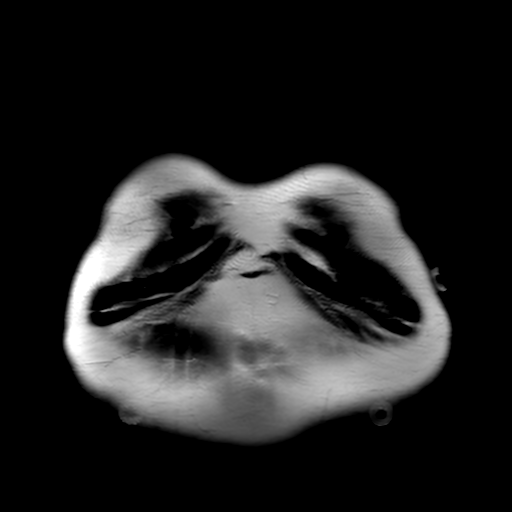

[Series 5: ax ssfse nav · axial · 6.0mm · 0.74mm/px · 1 of 33 slices shown]
[im 1/33]
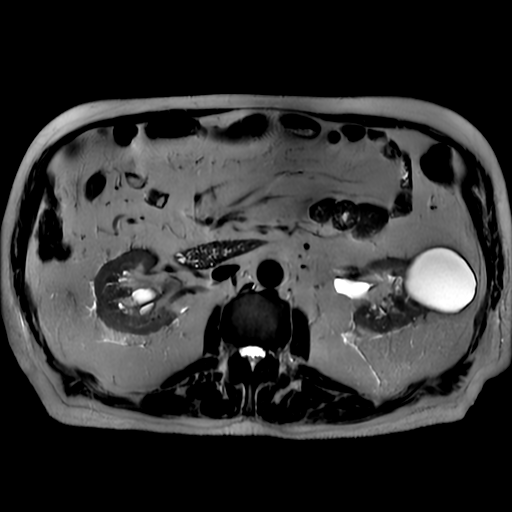

[Series 6: T2 fat-sat · axial · 6.0mm · 0.74mm/px · 1 of 33 slices shown]
[im 1/33]
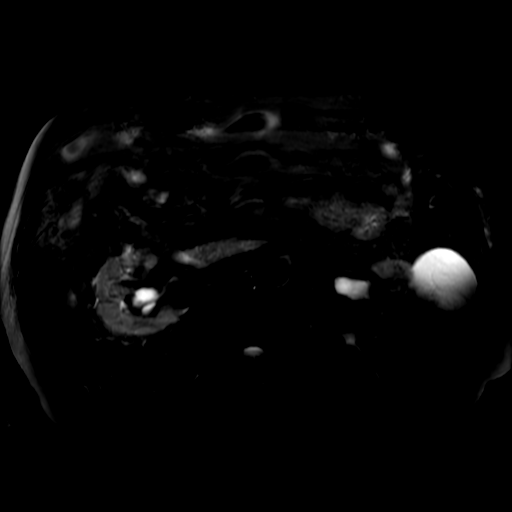

[Series 7: DWI b500 · axial · 8.0mm · 1.76mm/px · 1 of 50 slices shown]
[im 1/50]
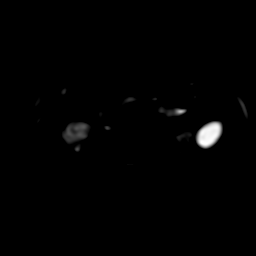

[Series 9: bSSFP · axial · 6.0mm · 0.78mm/px · 1 of 35 slices shown]
[im 1/35]
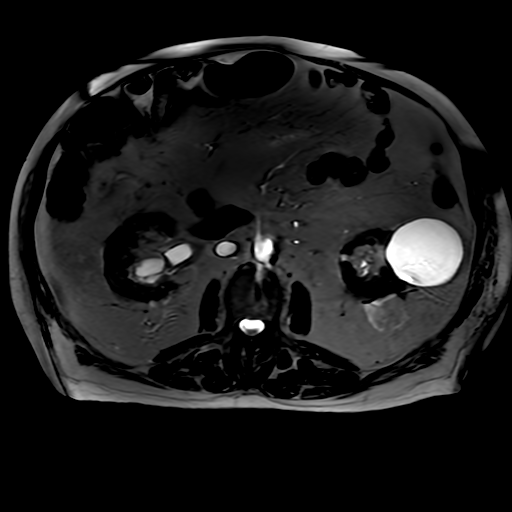

[Series 11: ax ssfse bh · axial · 6.0mm · 0.74mm/px · 1 of 32 slices shown]
[im 1/32]
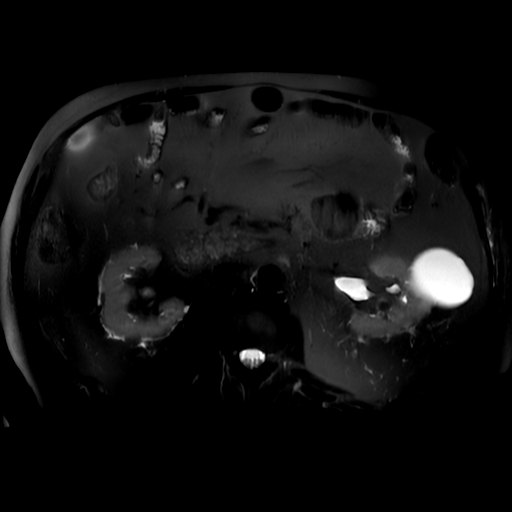

[Series 15: T1 dynamic · coronal · 3.4mm · 1.56mm/px · 4 of 136 slices shown]
[im 1/136]
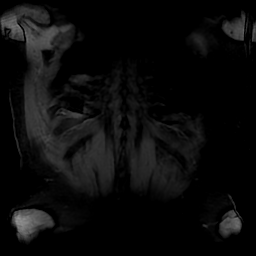
[im 46/136]
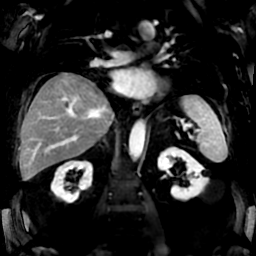
[im 91/136]
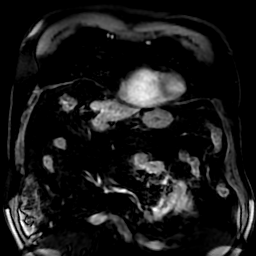
[im 136/136]
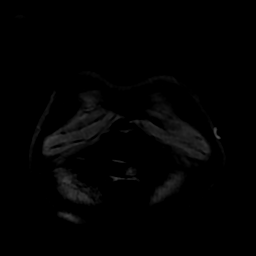

[Series 750: ADC · axial · 8.0mm · 1.76mm/px · 1 of 25 slices shown]
[im 1/25]
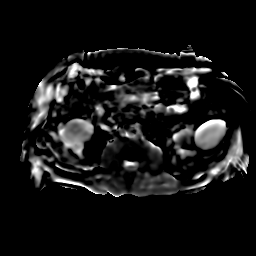

[Series 1400: T1 dynamic post-contrast · axial · non-contrast · 4.0mm · 0.86mm/px · z∈[-111,+103]mm · 3 of 108 slices shown (1 of 4)]
[im 1/108]
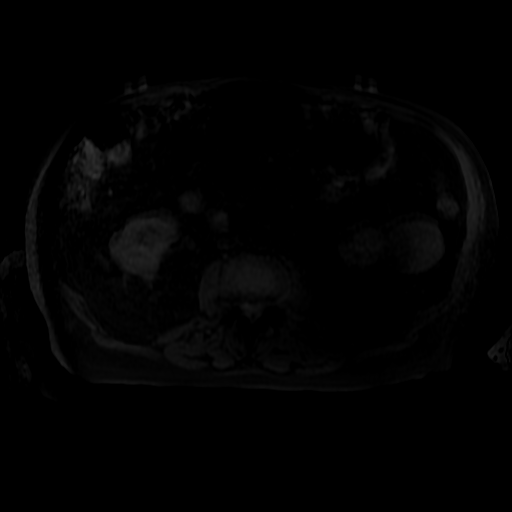
[im 54/108]
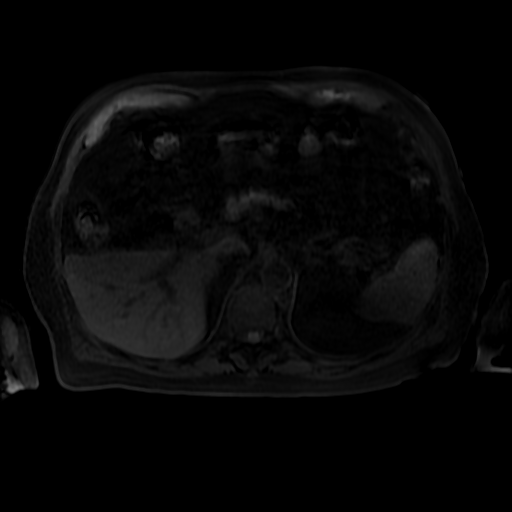
[im 108/108]
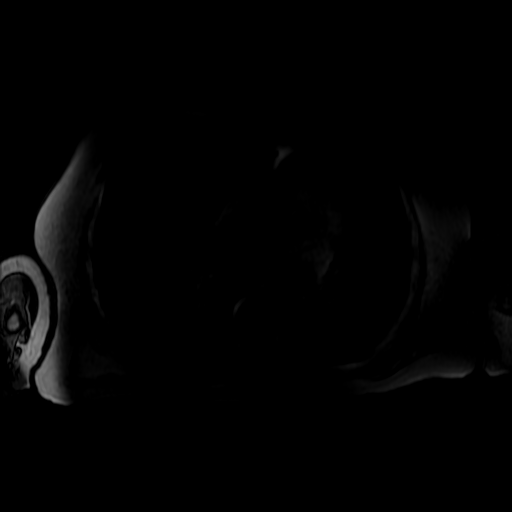

[Series 1401: T1 dynamic post-contrast · axial · non-contrast · 4.0mm · 0.86mm/px · z∈[-111,+103]mm · 3 of 108 slices shown (2 of 4)]
[im 1/108]
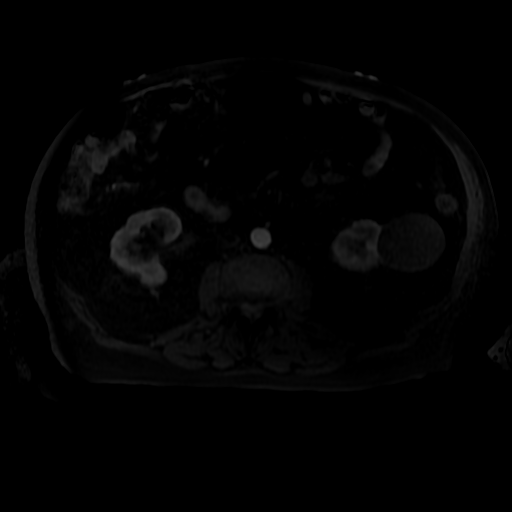
[im 54/108]
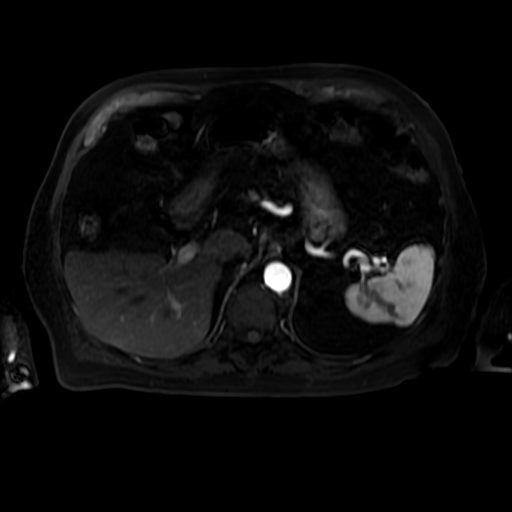
[im 108/108]
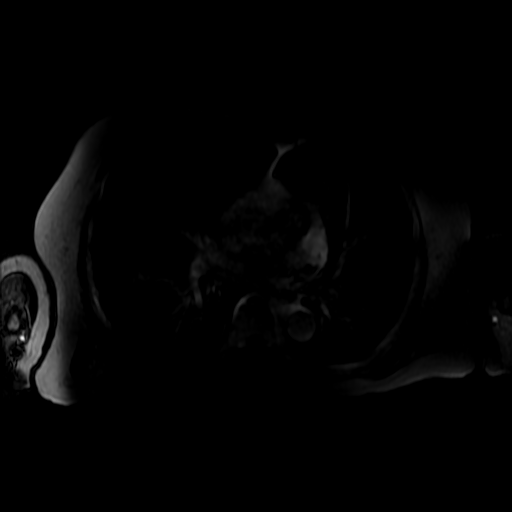

[Series 1402: T1 dynamic post-contrast · axial · non-contrast · 4.0mm · 0.86mm/px · z∈[-111,+103]mm · 3 of 108 slices shown (3 of 4)]
[im 1/108]
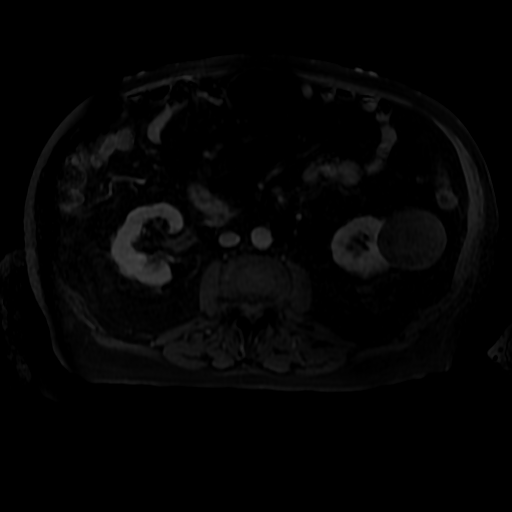
[im 54/108]
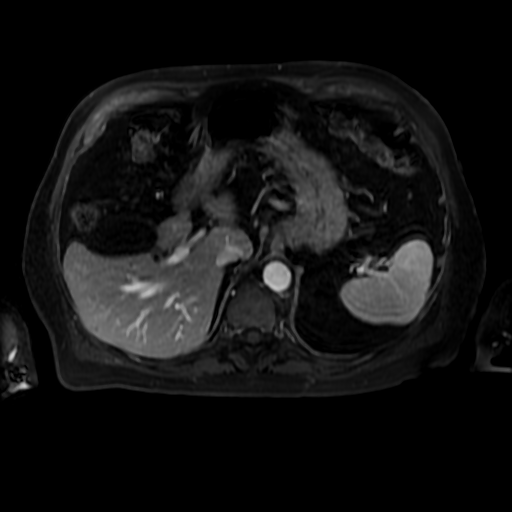
[im 108/108]
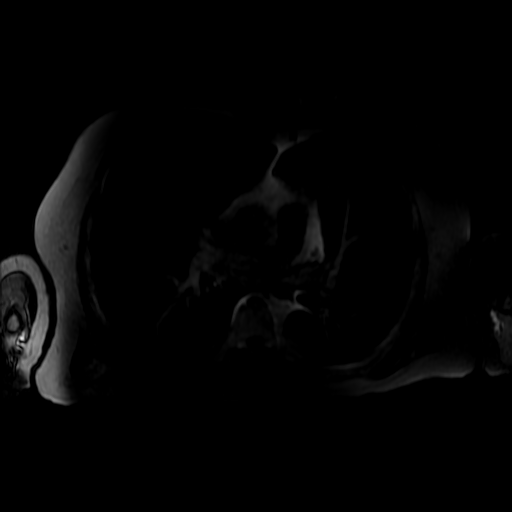

[Series 1403: T1 dynamic post-contrast · axial · non-contrast · 4.0mm · 0.86mm/px · 1 of 108 slices shown (4 of 4)]
[im 1/108]
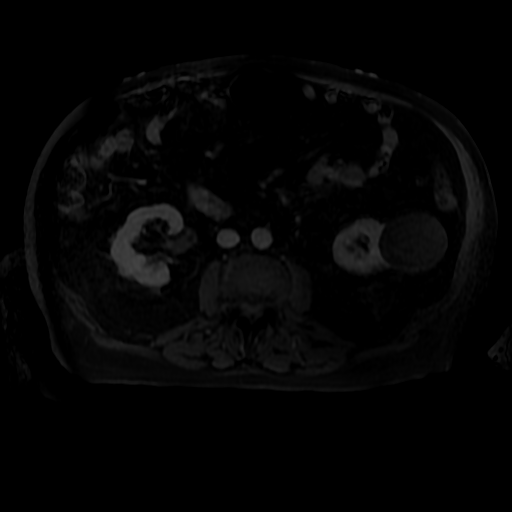

[22 of 48 positions shown; findings below may reference images not displayed]

FINDINGS: Lower chest: Unremarkable

Hepatobiliary: Numerous tiny T2 hyperintensity scattered throughout
both hepatic lobes are too small to characterize and likely benign.
Lesion of concern in the posterior right liver measures 1.9 cm on
axial T2 fat suppressed image 12 of series 6. This lesion restricts
diffusion and shows peripheral rim enhancement after IV contrast
administration. (See arterial phase T1 gradient image 38 of series
[DATE]). Tiny peripheral radical of the portal vein passes along the
inferior aspect of this cystic lesion.

Pancreas: No focal mass lesion. No dilatation of the main duct. No
intraparenchymal cyst. No peripancreatic edema.

Spleen:  No splenomegaly. No focal mass lesion.

Adrenals/Urinary Tract: No adrenal nodule or mass. Multiple cysts
are identified in both kidneys, left greater than right, measuring
up to 6.7 cm in the interpolar left kidney.

Stomach/Bowel: Stomach is unremarkable. No gastric wall thickening.
No evidence of outlet obstruction. Duodenum is normally positioned
as is the ligament of Treitz. No small bowel or colonic dilatation
within the visualized abdomen.

Vascular/Lymphatic: No abdominal aortic aneurysm There is no
gastrohepatic or hepatoduodenal ligament lymphadenopathy. No
retroperitoneal or mesenteric lymphadenopathy.

Other:  No intraperitoneal free fluid.

Musculoskeletal: No suspicious abnormal marrow enhancement within
the visualized bony anatomy.
IMPRESSION: 1. 1.9 cm rim lesion in the posterior right liver shows a thin rim
of peripheral rim enhancement. Central aspect of the lesion is
cystic/necrotic with evidence of restricted diffusion. Imaging
features are concerning for a metastatic lesion. PET-CT may prove
helpful to further evaluate.
2. Numerous tiny T2 hyperintensity scattered throughout both hepatic
lobes are too small to characterize and likely benign.
3. Bilateral renal cysts.
